# Patient Record
Sex: Female | Born: 1950 | Race: White | Hispanic: No | Marital: Married | Smoking: Never smoker
Health system: Southern US, Community
[De-identification: ages and names within clinical notes are randomized; demographics above are authoritative.]

## PROBLEM LIST (undated history)

## (undated) DIAGNOSIS — F329 Major depressive disorder, single episode, unspecified: Secondary | ICD-10-CM

## (undated) DIAGNOSIS — E785 Hyperlipidemia, unspecified: Secondary | ICD-10-CM

## (undated) DIAGNOSIS — G43909 Migraine, unspecified, not intractable, without status migrainosus: Secondary | ICD-10-CM

## (undated) DIAGNOSIS — K219 Gastro-esophageal reflux disease without esophagitis: Secondary | ICD-10-CM

## (undated) DIAGNOSIS — G4733 Obstructive sleep apnea (adult) (pediatric): Secondary | ICD-10-CM

## (undated) DIAGNOSIS — N200 Calculus of kidney: Secondary | ICD-10-CM

## (undated) DIAGNOSIS — K5730 Diverticulosis of large intestine without perforation or abscess without bleeding: Secondary | ICD-10-CM

## (undated) DIAGNOSIS — F32A Depression, unspecified: Secondary | ICD-10-CM

## (undated) DIAGNOSIS — Z86010 Personal history of colonic polyps: Secondary | ICD-10-CM

## (undated) DIAGNOSIS — I10 Essential (primary) hypertension: Secondary | ICD-10-CM

## (undated) DIAGNOSIS — M1990 Unspecified osteoarthritis, unspecified site: Secondary | ICD-10-CM

## (undated) HISTORY — DX: Depression, unspecified: F32.A

## (undated) HISTORY — DX: Calculus of kidney: N20.0

## (undated) HISTORY — DX: Unspecified osteoarthritis, unspecified site: M19.90

## (undated) HISTORY — DX: Major depressive disorder, single episode, unspecified: F32.9

## (undated) HISTORY — DX: Obstructive sleep apnea (adult) (pediatric): G47.33

## (undated) HISTORY — DX: Migraine, unspecified, not intractable, without status migrainosus: G43.909

## (undated) HISTORY — DX: Personal history of colonic polyps: Z86.010

## (undated) HISTORY — DX: Hyperlipidemia, unspecified: E78.5

## (undated) HISTORY — DX: Diverticulosis of large intestine without perforation or abscess without bleeding: K57.30

## (undated) HISTORY — DX: Gastro-esophageal reflux disease without esophagitis: K21.9

## (undated) HISTORY — PX: TUBAL LIGATION: SHX77

## (undated) HISTORY — DX: Essential (primary) hypertension: I10

---

## 2003-10-31 HISTORY — PX: STONE EXTRACTION WITH BASKET: SHX5318

## 2004-04-21 ENCOUNTER — Other Ambulatory Visit: Admission: RE | Admit: 2004-04-21 | Discharge: 2004-04-21 | Payer: Self-pay | Admitting: Internal Medicine

## 2004-04-21 ENCOUNTER — Ambulatory Visit: Payer: Self-pay | Admitting: Internal Medicine

## 2004-04-22 ENCOUNTER — Ambulatory Visit: Payer: Self-pay | Admitting: Internal Medicine

## 2004-10-20 ENCOUNTER — Ambulatory Visit: Payer: Self-pay | Admitting: Internal Medicine

## 2005-04-21 ENCOUNTER — Ambulatory Visit: Payer: Self-pay | Admitting: Internal Medicine

## 2005-04-21 ENCOUNTER — Other Ambulatory Visit: Admission: RE | Admit: 2005-04-21 | Discharge: 2005-04-21 | Payer: Self-pay | Admitting: Internal Medicine

## 2005-04-21 LAB — CONVERTED CEMR LAB: Pap Smear: NORMAL

## 2005-05-26 ENCOUNTER — Ambulatory Visit: Payer: Self-pay | Admitting: Internal Medicine

## 2005-06-05 ENCOUNTER — Ambulatory Visit: Payer: Self-pay | Admitting: Internal Medicine

## 2005-06-19 ENCOUNTER — Ambulatory Visit: Payer: Self-pay | Admitting: Internal Medicine

## 2005-06-23 ENCOUNTER — Ambulatory Visit: Payer: Self-pay | Admitting: Internal Medicine

## 2005-07-30 HISTORY — PX: ESOPHAGOGASTRODUODENOSCOPY: SHX1529

## 2005-08-03 ENCOUNTER — Encounter (INDEPENDENT_AMBULATORY_CARE_PROVIDER_SITE_OTHER): Payer: Self-pay | Admitting: Specialist

## 2005-08-03 ENCOUNTER — Ambulatory Visit: Payer: Self-pay | Admitting: Internal Medicine

## 2005-09-14 ENCOUNTER — Ambulatory Visit: Payer: Self-pay | Admitting: Internal Medicine

## 2005-09-14 ENCOUNTER — Encounter: Payer: Self-pay | Admitting: Internal Medicine

## 2005-09-17 ENCOUNTER — Ambulatory Visit: Payer: Self-pay | Admitting: Specialist

## 2005-09-17 ENCOUNTER — Encounter: Payer: Self-pay | Admitting: Pulmonary Disease

## 2005-10-19 ENCOUNTER — Ambulatory Visit: Payer: Self-pay | Admitting: Internal Medicine

## 2006-04-26 ENCOUNTER — Ambulatory Visit: Payer: Self-pay | Admitting: Internal Medicine

## 2006-04-28 ENCOUNTER — Ambulatory Visit: Payer: Self-pay | Admitting: Internal Medicine

## 2006-04-28 ENCOUNTER — Encounter: Payer: Self-pay | Admitting: Internal Medicine

## 2006-05-13 ENCOUNTER — Ambulatory Visit: Payer: Self-pay | Admitting: Pulmonary Disease

## 2006-05-27 ENCOUNTER — Ambulatory Visit: Payer: Self-pay | Admitting: Internal Medicine

## 2006-06-08 ENCOUNTER — Ambulatory Visit: Payer: Self-pay | Admitting: Internal Medicine

## 2006-08-30 ENCOUNTER — Ambulatory Visit: Payer: Self-pay | Admitting: Internal Medicine

## 2006-09-21 ENCOUNTER — Ambulatory Visit: Payer: Self-pay | Admitting: Internal Medicine

## 2007-04-21 ENCOUNTER — Encounter: Payer: Self-pay | Admitting: Internal Medicine

## 2007-04-21 DIAGNOSIS — G4733 Obstructive sleep apnea (adult) (pediatric): Secondary | ICD-10-CM | POA: Insufficient documentation

## 2007-04-21 DIAGNOSIS — N200 Calculus of kidney: Secondary | ICD-10-CM | POA: Insufficient documentation

## 2007-04-21 DIAGNOSIS — M810 Age-related osteoporosis without current pathological fracture: Secondary | ICD-10-CM | POA: Insufficient documentation

## 2007-04-21 DIAGNOSIS — F3289 Other specified depressive episodes: Secondary | ICD-10-CM | POA: Insufficient documentation

## 2007-04-21 DIAGNOSIS — F329 Major depressive disorder, single episode, unspecified: Secondary | ICD-10-CM

## 2007-04-21 DIAGNOSIS — K219 Gastro-esophageal reflux disease without esophagitis: Secondary | ICD-10-CM

## 2007-04-21 DIAGNOSIS — I10 Essential (primary) hypertension: Secondary | ICD-10-CM | POA: Insufficient documentation

## 2007-04-23 DIAGNOSIS — M1990 Unspecified osteoarthritis, unspecified site: Secondary | ICD-10-CM | POA: Insufficient documentation

## 2007-05-06 ENCOUNTER — Ambulatory Visit: Payer: Self-pay | Admitting: Internal Medicine

## 2007-05-07 ENCOUNTER — Encounter: Payer: Self-pay | Admitting: Internal Medicine

## 2007-05-31 ENCOUNTER — Encounter: Payer: Self-pay | Admitting: Internal Medicine

## 2007-07-06 ENCOUNTER — Ambulatory Visit: Payer: Self-pay | Admitting: Internal Medicine

## 2007-07-06 ENCOUNTER — Encounter: Payer: Self-pay | Admitting: Internal Medicine

## 2007-07-12 ENCOUNTER — Encounter (INDEPENDENT_AMBULATORY_CARE_PROVIDER_SITE_OTHER): Payer: Self-pay | Admitting: *Deleted

## 2007-08-18 ENCOUNTER — Telehealth (INDEPENDENT_AMBULATORY_CARE_PROVIDER_SITE_OTHER): Payer: Self-pay | Admitting: *Deleted

## 2007-08-23 ENCOUNTER — Ambulatory Visit: Payer: Self-pay | Admitting: Ophthalmology

## 2008-07-05 ENCOUNTER — Telehealth: Payer: Self-pay | Admitting: Internal Medicine

## 2008-07-26 ENCOUNTER — Ambulatory Visit: Payer: Self-pay | Admitting: Family Medicine

## 2008-09-11 ENCOUNTER — Ambulatory Visit: Payer: Self-pay | Admitting: Obstetrics & Gynecology

## 2009-01-28 ENCOUNTER — Ambulatory Visit: Payer: Self-pay | Admitting: Internal Medicine

## 2009-04-05 ENCOUNTER — Ambulatory Visit: Payer: Self-pay | Admitting: Internal Medicine

## 2009-04-16 ENCOUNTER — Encounter: Payer: Self-pay | Admitting: Internal Medicine

## 2009-05-01 HISTORY — PX: TOTAL HIP ARTHROPLASTY: SHX124

## 2009-05-02 ENCOUNTER — Inpatient Hospital Stay (HOSPITAL_COMMUNITY): Admission: RE | Admit: 2009-05-02 | Discharge: 2009-05-05 | Payer: Self-pay | Admitting: Orthopedic Surgery

## 2009-06-25 ENCOUNTER — Telehealth: Payer: Self-pay | Admitting: Internal Medicine

## 2009-06-26 ENCOUNTER — Ambulatory Visit: Payer: Self-pay | Admitting: Pulmonary Disease

## 2009-07-01 ENCOUNTER — Ambulatory Visit: Payer: Self-pay | Admitting: Internal Medicine

## 2009-07-01 DIAGNOSIS — R030 Elevated blood-pressure reading, without diagnosis of hypertension: Secondary | ICD-10-CM | POA: Insufficient documentation

## 2009-07-01 LAB — CONVERTED CEMR LAB: Glucose, Bld: 84 mg/dL

## 2009-08-22 ENCOUNTER — Telehealth: Payer: Self-pay | Admitting: Family Medicine

## 2009-09-02 ENCOUNTER — Encounter: Payer: Self-pay | Admitting: Pulmonary Disease

## 2009-09-03 ENCOUNTER — Encounter: Payer: Self-pay | Admitting: Pulmonary Disease

## 2009-11-12 ENCOUNTER — Ambulatory Visit (HOSPITAL_COMMUNITY): Admission: RE | Admit: 2009-11-12 | Discharge: 2009-11-12 | Payer: Self-pay | Admitting: Podiatry

## 2009-11-14 ENCOUNTER — Encounter: Payer: Self-pay | Admitting: Internal Medicine

## 2009-11-15 ENCOUNTER — Ambulatory Visit: Payer: Self-pay | Admitting: Orthopedic Surgery

## 2009-12-18 ENCOUNTER — Encounter: Payer: Self-pay | Admitting: Internal Medicine

## 2010-01-07 ENCOUNTER — Ambulatory Visit: Payer: Self-pay | Admitting: Internal Medicine

## 2010-01-07 DIAGNOSIS — M109 Gout, unspecified: Secondary | ICD-10-CM

## 2010-03-28 ENCOUNTER — Telehealth: Payer: Self-pay | Admitting: Internal Medicine

## 2010-04-08 ENCOUNTER — Ambulatory Visit: Payer: Self-pay | Admitting: Family Medicine

## 2010-04-23 ENCOUNTER — Ambulatory Visit: Payer: Self-pay

## 2010-04-24 ENCOUNTER — Encounter: Payer: Self-pay | Admitting: Pulmonary Disease

## 2010-04-29 ENCOUNTER — Telehealth (INDEPENDENT_AMBULATORY_CARE_PROVIDER_SITE_OTHER): Payer: Self-pay | Admitting: *Deleted

## 2010-06-09 ENCOUNTER — Ambulatory Visit
Admission: RE | Admit: 2010-06-09 | Discharge: 2010-06-09 | Payer: Self-pay | Source: Home / Self Care | Attending: Internal Medicine | Admitting: Internal Medicine

## 2010-06-09 ENCOUNTER — Encounter: Payer: Self-pay | Admitting: Internal Medicine

## 2010-06-09 DIAGNOSIS — H9190 Unspecified hearing loss, unspecified ear: Secondary | ICD-10-CM | POA: Insufficient documentation

## 2010-06-23 ENCOUNTER — Encounter: Payer: Self-pay | Admitting: Internal Medicine

## 2010-07-01 NOTE — Progress Notes (Signed)
Summary: ? BP elevated  Phone Note Call from Patient Call back at [phone]   Caller: Patient Call For: Cindee Salt MD Summary of Call: Pt was told by pt's physical therapist needed to have BP check to see if needed medication.  Two weeks ago Physical therapist took BP and was 160/95. Presently pt does not take BP med. Pt wanted to see if Dr. Alphonsus Sias felt that was high enough to come in for an appt. Initial call taken by: Lewanda Rife LPN,  June 25, 2009 12:09 PM  Follow-up for Phone Call        If it was consistently elevated at that level, meds would be appropriate. On her last visit here, it was 118/80 If she feels okay, have her set up an appt in the next few weeks to recheck BP (unless therapist rechecks and it is not elevated anymore) Follow-up by: Cindee Salt MD,  June 25, 2009 2:04 PM  Additional Follow-up for Phone Call Additional follow up Details #1::        pt scheduled appt on 07/01/2009 Additional Follow-up by: Mervin Hack CMA Duncan Dull),  June 25, 2009 2:49 PM

## 2010-07-01 NOTE — Progress Notes (Signed)
Summary: needs antibiotic  Phone Note Call from Patient Call back at Home Phone [phone]   Caller: Patient Call For: Cindee Salt MD Summary of Call: Patient has a cracked tooth and is going to the dentist next wed. She had a hip replacement last December.  She is asking for an antibiotic be called in to [facility].  Initial call taken by: Melody Comas,  August 22, 2009 2:11 PM    Prescriptions: AMOXICILLIN 500 MG CAPS (AMOXICILLIN) take 4 tabs about 1 hour before dentist appt  #4 x 1   Entered and Authorized by:   Ruthe Mannan MD   Signed by:   Ruthe Mannan MD on 08/22/2009   Method used:   Electronically to        [facility]. [address] [phone]* (retail)       [address] [place]  [address]       Ph: [phone]       Fax: [phone]   RxID:   [id number]   Appended Document: needs antibiotic Spoke with patient and advised results.

## 2010-07-01 NOTE — Assessment & Plan Note (Signed)
Summary: BLOOD PRESSURE/DS   Vital Signs:  Patient profile:   60 year old female Weight:      188 pounds Temp:     98.5 degrees F oral Pulse rate:   68 / minute Pulse rhythm:   regular BP sitting:   128 / 80  (left arm) Cuff size:   large  Vitals Entered By: Mervin Hack CMA Duncan Dull) (July 01, 2009 12:35 PM)  Serial Vital Signs/Assessments:  Time      Position  BP       Pulse  Resp  Temp     By           R Arm     156/102                        Cindee Salt MD  CC: discuss blood pressure   History of Present Illness: Had right THR in December still using cane but not using it at home progressing satisfactorily  PT at home noted increasing BP As high as 160/95 Was taking before starting therapy  Has been eating out more ??more salt  No headaches Had slight chest pain once---went away on its own. Not exertional No SOB  Sugar went up to 203 while in the hospital she is concerned about this  Allergies: 1)  ! Percocet 2)  Wellbutrin (Bupropion Hcl) 3)  Paxil (Paroxetine Hcl)  Past History:  Past medical, surgical, family and social histories (including risk factors) reviewed for relevance to current acute and chronic problems.  Past Medical History: Reviewed history from 04/21/2007 and no changes required. Depression Diverticulosis, colon GERD Hypertension Osteoporosis Kidney stones Osteoarthritis Obstructive sleep apnea Migraines  Past Surgical History: Kidney stone ([facility]off) basket retrieval 06/05 Tubal ligation 1990's Cesarean x 1 Vaginal x 1 EGD - stricture dilated 03/07 Right THR-- 12/10    Dr Despina Hick  Family History: Reviewed history from 04/21/2007 and no changes required. Father: Alive, Hep C, HTN Mother died @ age 57, MI, DM Siblings: 2 Brothers alive CAD in [place] GF (CHF) & mom HTN in  Dad, ? Mom DM  in  Mom, Mat GM Breast cancer in  Pat GM No colon cancer   Social History: Reviewed history from 04/05/2009 and no changes  required. Marital Status: Married Children: 2 Occupation: Embroidering work--sports uniforms Never Smoked Alcohol use-occ  Review of Systems       weight is up 6# Having trouble sleeping---did see Dr Shelle Iron recently about adjusting aspects of her CPAP  Ongoing left foot pain  Physical Exam  General:  alert and normal appearance.   Neck:  supple, no masses, and no thyromegaly.   Lungs:  normal respiratory effort and normal breath sounds.   Heart:  normal rate, regular rhythm, no murmur, and no gallop.   Msk:  left foot with small arch no tenderness Pulses:  1+ pulse in left foot Extremities:  no edema Psych:  normally interactive, good eye contact, not anxious appearing, and not depressed appearing.     Impression & Recommendations:  Problem # 1:  ELEVATED BLOOD PRESSURE WITHOUT DIAGNOSIS OF HYPERTENSION (ICD-796.2) Assessment New no indication of need for BP meds reassured  BP today: 128/80 Prior BP: 126/82 (06/26/2009)  Instructed in low sodium diet (DASH Handout) and behavior modification.    Problem # 2:  HYPERGLYCEMIA (ICD-790.29) Assessment: New  post op random is normal now reassured  Orders: Glucose, (CBG) (78469)  Complete Medication List: 1)  One-a-day Extras  Antioxidant Caps (Multiple vitamins-minerals) .... Take 1 by mouth once daily 2)  Vitamin D 1000 Unit Tabs (Cholecalciferol) .... Take 1 gel tab by mouth once daily 3)  Amoxicillin 500 Mg Caps (Amoxicillin) .... Take 4 tabs about 1 hour before dentist appt 4)  Aspirin Low Dose 81 Mg Tabs (Aspirin) .... Take 2 tabs by mouth at bedtime 5)  Calcium 500 Mg Tabs (Calcium) .... Take 1 by mouth once daily 6)  Magnesium 500 Mg Tabs (Magnesium) .... Take 1 by mouth once daily  Patient Instructions: 1)  Please get stability shoes for foot pain 2)  Please schedule a follow-up appointment in 6 months .   Current Allergies (reviewed today): ! PERCOCET WELLBUTRIN (BUPROPION HCL) PAXIL (PAROXETINE  HCL)  Laboratory Results   Blood Tests     Glucose (random): 84 mg/dL   (Normal Range: 16-109)

## 2010-07-01 NOTE — Letter (Signed)
Summary: [facility]  [facility]   Imported By: Lanelle Bal 12/03/2009 11:53:48  _____________________________________________________________________  External Attachment:    Type:   Image     Comment:   External Document  Appended Document: [facility] seen  for chronic left foot and ankle pain checking MRI to see if posterior tibialis tear voltaren gel orthotics

## 2010-07-01 NOTE — Assessment & Plan Note (Signed)
Summary: 6/mo follow up/ alc   Vital Signs:  Patient profile:   60 year old female Weight:      187.25 pounds Temp:     98.7 degrees F oral Pulse rate:   88 / minute Pulse rhythm:   regular BP sitting:   120 / 84  (left arm) Cuff size:   large  Vitals Entered By: Sydell Axon LPN (January 07, 2010 9:05 AM) CC: 6 Month follow-up   History of Present Illness: Doing okay  Had pinched nerve in neck about 1 month ago affecting her left arm and leg Used soft cervical collar didn't have to miss work No imaging needed other than plain films (arthritis noted)---- seen in urgent care then seen by ortho Brief course of prednisone still with slight numbness in left 1st and 2nd fingers  Hasn't checked BP No headaches No sig chest pain SOme SOB with the left side symptoms---mostly it felt odd on her left side  Uloric has kept gout quiet Not needing colcrys  hasn't been consistent with vitamin D  Allergies: 1)  ! Percocet 2)  Wellbutrin (Bupropion Hcl) 3)  Paxil (Paroxetine Hcl)  Past History:  Past medical, surgical, family and social histories (including risk factors) reviewed for relevance to current acute and chronic problems.  Past Medical History: Depression Diverticulosis, colon GERD Hypertension Osteoporosis Kidney stones Osteoarthritis Obstructive sleep apnea Migraines Gout  Past Surgical History: Reviewed history from 07/01/2009 and no changes required. Kidney stone [facility]) basket retrieval 06/05 Tubal ligation 1990's Cesarean x 1 Vaginal x 1 EGD - stricture dilated 03/07 Right THR-- 12/10    Dr Despina Hick  Family History: Reviewed history from 04/21/2007 and no changes required. Father: Alive, Hep C, HTN Mother died @ age 18, MI, DM Siblings: 2 Brothers alive CAD in [place] GF (CHF) & mom HTN in  Dad, ? Mom DM  in  Mom, Mat GM Breast cancer in  Pat GM No colon cancer   Social History: Reviewed history from 04/05/2009 and no changes  required. Marital Status: Married Children: 2 Occupation: Embroidering work--sports uniforms Never Smoked Alcohol use-occ  Review of Systems       trying to be careful with diet weight fairly stable Trouble exercising with her foot---no room in house for treadmill. Tries to walk sleeps variably---occ awakens despite sleep apnea  Physical Exam  General:  alert and normal appearance.   Neck:  supple, no masses, no thyromegaly, no carotid bruits, and no cervical lymphadenopathy.   Lungs:  normal respiratory effort, no intercostal retractions, no accessory muscle use, and normal breath sounds.   Heart:  normal rate, regular rhythm, no murmur, and no gallop.   Msk:  no joint tenderness and no joint swelling.   Extremities:  no edema Neurologic:  strength normal in all extremities.   Favors left foot walking and getting up on table Psych:  normally interactive, good eye contact, not anxious appearing, and not depressed appearing.     Impression & Recommendations:  Problem # 1:  HYPERTENSION (ICD-401.9) Assessment Unchanged BP fine without meds discussed fitness  BP today: 120/84 Prior BP: 128/80 (07/01/2009)  Problem # 2:  GOUT (ICD-274.9) Assessment: Improved  quiet on the uloric will check labs Rx done  Her updated medication list for this problem includes:    Uloric 40 Mg Tabs (Febuxostat) .Marland Kitchen... Take one by mouth daily    Colcrys 0.6 Mg Tabs (Colchicine) .Marland Kitchen... Take one by mouth daily as needed  during gout attack  Orders:  TLB-Renal Function Panel (80069-RENAL) TLB-Hepatic/Liver Function Pnl (80076-HEPATIC) Venipuncture (40981) TLB-Uric Acid, Blood (84550-URIC)  Problem # 3:  OSTEOPOROSIS (ICD-733.00) Assessment: Unchanged asked her to take the vitamin D regularly needs weight bearing exercise  Her updated medication list for this problem includes:    Vitamin D 1000 Unit Tabs (Cholecalciferol) .Marland Kitchen... Take 1 gel tab by mouth once daily    Calcium 500 Mg Tabs  (Calcium) .Marland Kitchen... Take 1 by mouth monthly  Problem # 4:  OSTEOARTHRITIS (ICD-715.90) Assessment: Unchanged limited from this discussed starting a program  Complete Medication List: 1)  One-a-day Extras Antioxidant Caps (Multiple vitamins-minerals) .... Take 1 by mouth occassionally 2)  Vitamin D 1000 Unit Tabs (Cholecalciferol) .... Take 1 gel tab by mouth once daily 3)  Calcium 500 Mg Tabs (Calcium) .... Take 1 by mouth monthly 4)  Uloric 40 Mg Tabs (Febuxostat) .... Take one by mouth daily 5)  Colcrys 0.6 Mg Tabs (Colchicine) .... Take one by mouth daily as needed  during gout attack  Patient Instructions: 1)  Please schedule a follow-up appointment in 6-9  months for physical Prescriptions: ULORIC 40 MG TABS (FEBUXOSTAT) Take one by mouth daily  #30 x 11   Entered and Authorized by:   Cindee Salt MD   Signed by:   Cindee Salt MD on 01/07/2010   Method used:   Electronically to        [facility] [address] [phone]* (retail)       [address] [place]  [address]       Ph: [phone]       Fax: [phone]   RxID:   [id number]   Current Allergies (reviewed today): ! PERCOCET WELLBUTRIN (BUPROPION HCL) PAXIL (PAROXETINE HCL)  Appended Document: 6/mo follow up/ alc

## 2010-07-01 NOTE — Letter (Signed)
Summary: [facility]   Imported By: Lester Parowan 05/05/2010 08:16:47  _____________________________________________________________________  External Attachment:    Type:   Image     Comment:   External Document

## 2010-07-01 NOTE — Progress Notes (Signed)
Summary: Need antibiotic for dental appt  Phone Note Call from Patient Call back at [phone]   Caller: Patient Call For: Cindee Salt MD Summary of Call: Pt said she needs antibiotic for dental appt next week. Pt had total hip replacement last month. Pt has appt for dental cleaning on 07/04/09.  Pt would like med called in to [facility]. [phone]. Please advise.  Initial call taken by: Lewanda Rife LPN,  June 25, 2009 11:33 AM  Follow-up for Phone Call        amoxicillin 500mg  4 tabs about 1 hour before dentist appt send #4 with 1 refill Follow-up by: Cindee Salt MD,  June 25, 2009 11:43 AM  Additional Follow-up for Phone Call Additional follow up Details #1::        Rx Called In, Spoke with patient and advised results.  Additional Follow-up by: Mervin Hack CMA Duncan Dull),  June 25, 2009 12:13 PM    New/Updated Medications: AMOXICILLIN 500 MG CAPS (AMOXICILLIN) take 4 tabs about 1 hour before dentist appt Prescriptions: AMOXICILLIN 500 MG CAPS (AMOXICILLIN) take 4 tabs about 1 hour before dentist appt  #4 x 1   Entered by:   Mervin Hack CMA (AAMA)   Authorized by:   Cindee Salt MD   Signed by:   Mervin Hack CMA (AAMA) on 06/25/2009   Method used:   Electronically to        [facility] [address] [phone]* (retail)       [address] [place]  [address]       Ph: [phone]       Fax: [phone]   RxID:   [id number]

## 2010-07-01 NOTE — Progress Notes (Signed)
Summary: wants to change to allopurinol  Phone Note Call from Patient Call back at Home Phone [phone]   Caller: Patient Call For: Martha Salt MD Summary of Call: Pt wants to change from uloric to allopurinol, she says this will be less costly.  She wants this sent to [facility] [address] Initial call taken by: Lowella Petties CMA, AAMA,  March 28, 2010 8:33 AM  Follow-up for Phone Call        Okay to change to allopurinol 300mg  daily 1 yr Rx Follow-up by: Martha Salt MD,  March 28, 2010 8:53 AM  Additional Follow-up for Phone Call Additional follow up Details #1::        Rx Called In, Spoke with patient and advised results.  Additional Follow-up by: Mervin Hack CMA Duncan Dull),  March 28, 2010 12:21 PM    New/Updated Medications: ALLOPURINOL 300 MG TABS (ALLOPURINOL) TAKE 1 by mouth once daily Prescriptions: ALLOPURINOL 300 MG TABS (ALLOPURINOL) TAKE 1 by mouth once daily  #90 x 3   Entered by:   Mervin Hack CMA (AAMA)   Authorized by:   Martha Salt MD   Signed by:   Mervin Hack CMA (AAMA) on 03/28/2010   Method used:   Faxed to ...       [facility] (retail)       [address]       [place], [place]    [place]       Ph: [phone]       Fax: [phone]   RxID:   [id number]

## 2010-07-01 NOTE — Letter (Signed)
Summary: [facility]  [facility]   Imported By: Lanelle Bal 01/03/2010 13:52:08  _____________________________________________________________________  External Attachment:    Type:   Image     Comment:   External Document  Appended Document: [facility] Ankle MRI reviewed New shoes and orthotics recommended for now

## 2010-07-01 NOTE — Progress Notes (Signed)
Summary: office visit due in January  Phone Note Outgoing Call   Call placed by: Kandice Hams CMA,  April 29, 2010 12:03 PM Call placed to: Patient Summary of Call: Patient due for office visit in Jan per Dr clance, left msg for pt to call to schedule .Kandice Hams [facility]  April 29, 2010 12:03 PM

## 2010-07-01 NOTE — Miscellaneous (Signed)
Summary: Orders Update  Clinical Lists Changes  Orders: Added new Referral order of DME Referral (DME) - Signed 

## 2010-07-01 NOTE — Assessment & Plan Note (Signed)
Summary: self referral for osa   Copy to:  Self Referral Primary Provider/Referring Provider:  Cindee Salt MD  CC:  Sleep Consult.  History of Present Illness: The pt is a 60y/o female who comes in today for management of osa.  She has been seen in the past for mild osa, and has been on cpap since that time.  She was last seen 2007, and never followed up with me.  She comes in today where she is having issues with cpap tolerance.  She is wearing a full face mask, but it is 80-25 years old.  She is having difficulties with air gulping, leading to bloating and discomfort, as well as intermittant nausea.  She is not able to wear nasal mask or nasal pillows due to mouth opening.  She typically goes to bed at 11pm, and arises at 8:30am to start her day.  She does have a lot of awakenings during the night, and is not completely rested in the am's upon arising.  She has some sleep pressure with periods of inactivity during the day, but this has become more of an issue since she had hip surgery.  She thinks that MSK discomfort at night may be contributing to this.  Her epworth score today is elevated at 15.  She denies having kicking at night, but does have a difficult time since the surgery finding a comfortable sleeping position.  Of note, her weight is up only 6 pounds since the last visit.  Current Medications (verified): 1)  One-A-Day Extras Antioxidant  Caps (Multiple Vitamins-Minerals) .... Take 1 By Mouth Once Daily 2)  Vitamin D 1000 Unit Tabs (Cholecalciferol) .... Take 1 Gel Tab By Mouth Once Daily 3)  Naftin 1 % Crea (Naftifine Hcl) .... Apply Two Times A Day To Groin Rash Till Clear 4)  Amoxicillin 500 Mg Caps (Amoxicillin) .... Take 4 Tabs About 1 Hour Before Dentist Appt 5)  Aspirin Low Dose 81 Mg Tabs (Aspirin) .... Take 2 Tabs By Mouth At Bedtime 6)  Calcium .... Take 1 Tab By Mouth At Bedtime 7)  Magnesium .... Take 1 Tab By Mouth At Bedtime  Allergies: 1)  ! Percocet 2)   Wellbutrin (Bupropion Hcl) 3)  Paxil (Paroxetine Hcl)  Past History:  Past Medical History: Reviewed history from 04/21/2007 and no changes required. Depression Diverticulosis, colon GERD Hypertension Osteoporosis Kidney stones Osteoarthritis Obstructive sleep apnea Migraines  Past Surgical History: Reviewed history from 04/21/2007 and no changes required. Kidney stone (Storoff) basket retrieval 06/05 Tubal ligation 1990's Cesarean x 1 Vaginal x 1 EGD - stricture dilated 03/07  Family History: Reviewed history from 04/21/2007 and no changes required. Father: Alive, Hep C, HTN Mother died @ age 101, MI, DM Siblings: 2 Brothers alive CAD in [place] GF (CHF) & mom HTN in  Dad, ? Mom DM  in  Mom, Mat GM Breast cancer in  Pat GM No colon cancer   Social History: Reviewed history from 04/05/2009 and no changes required. Marital Status: Married Children: 2 Occupation: Embroidering work--sports uniforms Never Smoked Alcohol use-occ  Review of Systems       The patient complains of abdominal pain, nasal congestion/difficulty breathing through nose, sneezing, hand/feet swelling, and joint stiffness or pain.  The patient denies shortness of breath with activity, shortness of breath at rest, productive cough, non-productive cough, coughing up blood, chest pain, irregular heartbeats, acid heartburn, indigestion, loss of appetite, weight change, difficulty swallowing, sore throat, tooth/dental problems, headaches, itching, ear ache, anxiety, depression,  rash, change in color of mucus, and fever.    Vital Signs:  Patient profile:   60 year old female Height:      62 inches Weight:      190 pounds BMI:     34.88 O2 Sat:      93 % on Room air Temp:     97.8 degrees F oral Pulse rate:   95 / minute BP sitting:   126 / 82  (left arm) Cuff size:   regular  Vitals Entered By: Arman Filter LPN (June 26, 2009 9:07 AM)  O2 Flow:  Room air CC: Sleep Consult Comments Medications  reviewed with patient Arman Filter LPN  June 26, 2009 9:14 AM    Physical Exam  General:  ow female in nad Eyes:  PERRLA and EOMI.   Nose:  narrowed bilat but patent, no discharge. Mouth:  mild elongation of soft palate and uvula Neck:  no jvd, tmg, LN Lungs:  clear to auscultation Heart:  rrr, no mrg Abdomen:  soft and nontender, bs+ Extremities:  no edema noted, pulses intact distally no cyanosis Neurologic:  alert and oriented, moves all 4.   Impression & Recommendations:  Problem # 1:  SLEEP APNEA, OBSTRUCTIVE (ICD-327.23) the pt has known mild osa, with positive clinical response to cpap.  She is now having issues with air gulping, and also an increase in symptoms of daytime sleepiness.  Although, I suspect some of the sleepiness is due to her MSK issues disrupting sleep.  Air gulping can be a difficult issue to troubleshoot, and we typically make changes to mask, pressure delivery modes, and re-optimize pressure.  Her mask is over a year old, and therefore would like to get her a new one.  Will also get her an autotitrating device for 2 weeks to re-optimize her pressure, and also to see if her air gulping is better on varying pressure.  I have also encouraged her to work on weight loss.  Medications Added to Medication List This Visit: 1)  Aspirin Low Dose 81 Mg Tabs (Aspirin) .... Take 2 tabs by mouth at bedtime 2)  Calcium  .... Take 1 tab by mouth at bedtime 3)  Magnesium  .... Take 1 tab by mouth at bedtime  Other Orders: New Patient Level IV (16109) DME Referral (DME)  Patient Instructions: 1)  will get you a new mask.  Look at some of the newer options 2)  will get you an auto titrating machine for 2 weeks to re-optimize your pressure, and also to see if you have less air gulping on this type of pressure delivery. 3)  work on weight loss 4)  will arrange followup with me once we get the above information.

## 2010-07-03 NOTE — Assessment & Plan Note (Signed)
Summary: CPX/JRR   Vital Signs:  Patient profile:   60 year old female Weight:      191 pounds BMI:     35.06 Temp:     98.9 degrees F oral Pulse rate:   90 / minute Pulse rhythm:   regular BP sitting:   142 / 98  (left arm) Cuff size:   regular  Vitals Entered By: Mervin Hack CMA Duncan Dull) (June 09, 2010 11:54 AM) CC: adult physical, Hypertension Management   History of Present Illness: Doing okay Just had gyn exam-- hasn't heard about pap yet Did have mammogram in past year  Having some trouble with gums Does keep up with the dentist  has done okay with the allopurinol Does get some flares with her foot hasn't taken other meds lately Has used colchicine in past    Hypertension History:      Positive major cardiovascular risk factors include female age 60 years old or older and hypertension.  Negative major cardiovascular risk factors include non-tobacco-user status.     Allergies: 1)  ! Percocet 2)  Wellbutrin (Bupropion Hcl) 3)  Paxil (Paroxetine Hcl)  Past History:  Past medical, surgical, family and social histories (including risk factors) reviewed for relevance to current acute and chronic problems.  Past Medical History: Reviewed history from 01/07/2010 and no changes required. Depression Diverticulosis, colon GERD Hypertension Osteoporosis Kidney stones Osteoarthritis Obstructive sleep apnea Migraines Gout  Past Surgical History: Reviewed history from 07/01/2009 and no changes required. Kidney stone [facility]) basket retrieval 06/05 Tubal ligation 1990's Cesarean x 1 Vaginal x 1 EGD - stricture dilated 03/07 Right THR-- 12/10    Dr Despina Hick  Family History: Reviewed history from 04/21/2007 and no changes required. Father: Alive, Hep C, HTN Mother died @ age 40, MI, DM Siblings: 2 Brothers alive CAD in [place] GF (CHF) & mom HTN in  Dad, ? Mom DM  in  Mom, Mat GM Breast cancer in  Pat GM No colon cancer   Social History: Reviewed  history from 04/05/2009 and no changes required. Marital Status: Married Children: 2 Occupation: Embroidering work--sports uniforms Never Smoked Alcohol use-occ  Review of Systems General:  weight is up a few pounds generally sleeps okay--uses CPAP but unsure of how well it works SOme AM fatigue but not bad wears seat belt. Eyes:  Complains of double vision; denies vision loss-1 eye; notes some mild diplopia when tired in PM--more like blurry due for eye exam. ENT:  Complains of decreased hearing and ringing in ears; ongoing hearing problems wants eval due for dentist--some gum problems. CV:  Complains of palpitations and shortness of breath with exertion; denies chest pain or discomfort, difficulty breathing at night, difficulty breathing while lying down, fainting, and lightheadness; Occ skip beats--rarely Fairly sedentary---some notice about DOE. Resp:  Denies cough and shortness of breath. GI:  Complains of indigestion; denies abdominal pain, bloody stools, change in bowel habits, dark tarry stools, nausea, and vomiting; occ hearburn--uses OTC ranitidine. GU:  Complains of urinary frequency; denies dysuria and incontinence; no sexual problems. MS:  Complains of joint pain; denies joint swelling; some left hip pain---Dr Waylan Rocher did check x-rays and just has early problems. Derm:  Complains of itching and lesion(s); denies rash; some brown spots under right breast Occ itching on thigh--relates to dragging a deer . Neuro:  Complains of headaches; denies numbness, tingling, and weakness; occ sensory changes on face No recent headaches. Psych:  Denies anxiety and depression. Heme:  Denies abnormal bruising and  enlarge lymph nodes. Allergy:  Denies seasonal allergies and sneezing.  Physical Exam  General:  alert and normal appearance.   Eyes:  pupils equal, pupils round, pupils reactive to light, and no optic disk abnormalities.   Ears:  R ear normal and L ear normal.   Mouth:  no  erythema, no exudates, and no lesions.   Neck:  supple, no masses, no thyromegaly, no carotid bruits, and no cervical lymphadenopathy.   Lungs:  normal respiratory effort, no intercostal retractions, no accessory muscle use, and normal breath sounds.   Heart:  normal rate, regular rhythm, no murmur, and no gallop.   Abdomen:  soft and non-tender.   Msk:  no joint tenderness and no joint swelling.   Pulses:  2+ in feet Extremities:  no edema Neurologic:  alert & oriented X3, strength normal in all extremities, and gait normal.   Skin:  no rashes and no suspicious lesions.   2 early seb keratoses under right breast Axillary Nodes:  No palpable lymphadenopathy Psych:  normally interactive, good eye contact, not anxious appearing, and not depressed appearing.     Impression & Recommendations:  Problem # 1:  PREVENTIVE HEALTH CARE (ICD-V70.0) Assessment Comment Only not yet due for colon recent pap up to date wtih mammo discussed fitness  Problem # 2:  HEARING LOSS (ICD-389.9) Assessment: New  more noticeable ready for eval  Orders: ENT Referral (ENT)  Problem # 3:  HYPERTENSION (ICD-401.9) Assessment: Deteriorated  will hold off on meds  discussed fitness and her efforts to control BP may need to try meds if not going down  BP today: 142/98 Prior BP: 120/84 (01/07/2010)  10 Yr Risk Heart Disease: Not enough information  Orders: Venipuncture (04540) TLB-Renal Function Panel (80069-RENAL) TLB-CBC Platelet - w/Differential (85025-CBCD) TLB-Hepatic/Liver Function Pnl (80076-HEPATIC) TLB-TSH (Thyroid Stimulating Hormone) (84443-TSH)  Problem # 4:  GOUT (ICD-274.9) Assessment: Comment Only  on allopurinol will check uric acid level  The following medications were removed from the medication list:    Uloric 40 Mg Tabs (Febuxostat) .Marland Kitchen... Take one by mouth daily Her updated medication list for this problem includes:    Allopurinol 300 Mg Tabs (Allopurinol) .Marland Kitchen... Take 1  by mouth once daily    Ibuprofen 200 Mg Tabs (Ibuprofen) .Marland Kitchen... 2-3 tabs by mouth three times a day with food as needed for gout pain  Orders: TLB-Uric Acid, Blood (84550-URIC)  Complete Medication List: 1)  One-a-day Extras Antioxidant Caps (Multiple vitamins-minerals) .... Take 1 by mouth occassionally 2)  Vitamin D 1000 Unit Tabs (Cholecalciferol) .... Take 1 gel tab by mouth once daily 3)  Calcium 500 Mg Tabs (Calcium) .... Take 1 by mouth monthly 4)  Allopurinol 300 Mg Tabs (Allopurinol) .... Take 1 by mouth once daily 5)  Ibuprofen 200 Mg Tabs (Ibuprofen) .... 2-3 tabs by mouth three times a day with food as needed for gout pain  Hypertension Assessment/Plan:      The patient's hypertensive risk group is category B: At least one risk factor (excluding diabetes) with no target organ damage.  Today's blood pressure is 142/98.    Patient Instructions: 1)  Please try ibuprofen as needed for the gout pain 2)  Please schedule a follow-up appointment in 6 months .  3)  Referral Appointment Information 4)  Day/Date: 5)  Time: 6)  Place/MD: 7)  Address: 8)  Phone/Fax: 9)  Patient given appointment information. Information/Orders faxed/mailed.   Orders Added: 1)  ENT Referral [ENT] 2)  Est. Patient 40-64  years [99396] 3)  TLB-Uric Acid, Blood [84550-URIC] 4)  Venipuncture [36415] 5)  TLB-Renal Function Panel [80069-RENAL] 6)  TLB-CBC Platelet - w/Differential [85025-CBCD] 7)  TLB-Hepatic/Liver Function Pnl [80076-HEPATIC] 8)  TLB-TSH (Thyroid Stimulating Hormone) [62952-WUX]    Current Allergies (reviewed today): ! PERCOCET WELLBUTRIN (BUPROPION HCL) PAXIL (PAROXETINE HCL)  Appended Document: CPX/JRR

## 2010-07-17 NOTE — Letter (Signed)
Summary: [facility]   [facility]   Imported By: Kassie Mends 07/07/2010 11:10:20  _____________________________________________________________________  External Attachment:    Type:   Image     Comment:   External Document  Appended Document: [facility]  bilateral sensorineural hearing loss recommending hearing aides

## 2010-09-02 LAB — BASIC METABOLIC PANEL
BUN: 15 mg/dL (ref 6–23)
CO2: 25 mEq/L (ref 19–32)
Calcium: 8.3 mg/dL — ABNORMAL LOW (ref 8.4–10.5)
Chloride: 103 mEq/L (ref 96–112)
Creatinine, Ser: 0.9 mg/dL (ref 0.4–1.2)
GFR calc Af Amer: >60 mL/min (ref >60)
GFR calc non Af Amer: >60 mL/min (ref >60)
GFR calc non Af Amer: >60 mL/min (ref >60)
Glucose, Bld: 127 mg/dL — ABNORMAL HIGH (ref 70–99)
Potassium: 4.4 mEq/L (ref 3.5–5.1)
Sodium: 137 mEq/L (ref 135–145)
Sodium: 137 mEq/L (ref 135–145)

## 2010-09-02 LAB — CBC
HCT: 33.3 % — ABNORMAL LOW (ref 36.0–46.0)
HCT: 35.2 % — ABNORMAL LOW (ref 36.0–46.0)
Hemoglobin: 11 g/dL — ABNORMAL LOW (ref 12.0–15.0)
MCHC: 33.1 g/dL (ref 30.0–36.0)
MCHC: 33.2 g/dL (ref 30.0–36.0)
MCV: 93.7 fL (ref 78.0–100.0)
MCV: 93.8 fL (ref 78.0–100.0)
Platelets: 264 10*3/uL (ref 150–400)
RBC: 4.28 MIL/uL (ref 3.87–5.11)
RDW: 12.6 % (ref 11.5–15.5)
RDW: 13.3 % (ref 11.5–15.5)
RDW: 13.3 % (ref 11.5–15.5)
WBC: 12.3 10*3/uL — ABNORMAL HIGH (ref 4.0–10.5)
WBC: 9.3 10*3/uL (ref 4.0–10.5)

## 2010-09-02 LAB — PROTIME-INR
INR: 1.73 — ABNORMAL HIGH (ref 0.00–1.49)
Prothrombin Time: 20.1 seconds — ABNORMAL HIGH (ref 11.6–15.2)

## 2010-09-02 LAB — TYPE AND SCREEN

## 2010-09-02 LAB — ABO/RH: ABO/RH(D): A POS

## 2010-09-03 LAB — URINE MICROSCOPIC-ADD ON

## 2010-09-03 LAB — PROTIME-INR: Prothrombin Time: 12.2 seconds (ref 11.6–15.2)

## 2010-09-03 LAB — COMPREHENSIVE METABOLIC PANEL
AST: 20 U/L (ref 0–37)
Alkaline Phosphatase: 68 U/L (ref 39–117)
BUN: 10 mg/dL (ref 6–23)
Calcium: 9.2 mg/dL (ref 8.4–10.5)
Chloride: 103 mEq/L (ref 96–112)
GFR calc non Af Amer: >60 mL/min (ref >60)
Total Bilirubin: 1 mg/dL (ref 0.3–1.2)
Total Protein: 7.2 g/dL (ref 6.0–8.3)

## 2010-09-03 LAB — URINALYSIS, ROUTINE W REFLEX MICROSCOPIC
Glucose, UA: NEGATIVE mg/dL
Ketones, ur: NEGATIVE mg/dL
Nitrite: NEGATIVE
Urobilinogen, UA: 0.2 mg/dL (ref 0.0–1.0)

## 2010-09-03 LAB — CBC
Hemoglobin: 15.8 g/dL — ABNORMAL HIGH (ref 12.0–15.0)
Platelets: 191 10*3/uL (ref 150–400)

## 2010-10-14 NOTE — Assessment & Plan Note (Signed)
NAME:  Martha Meyer, Martha Meyer                ACCOUNT NO.:  [id number]   MEDICAL RECORD NO.:  [id number]          PATIENT TYPE:  POB   LOCATION:  [facility] at [facility]         FACILITY:  [facility]   PHYSICIAN:  Tinnie Gens, MD        DATE OF BIRTH:  11-17-1950   DATE OF SERVICE:  04/08/2010                                  CLINIC NOTE   CHIEF COMPLAINT:  Physical exam.   HISTORY OF PRESENT ILLNESS:  The patient is a 60 year old gravida 2,  para 2 who has been menopausal since 1990s, who comes in today for  yearly exam.  She has no GYN complaints.  Except for urinary frequency,  she denies specifically stress urinary incontinence nor does she have  urge urinary incontinence.  She does have nocturia and urinary frequency  during the day.  She is concerned that she might have diabetes.  She  sees Dr. Pasty Arch and reports that he has not assured her he has checked  her for diabetes  although she is unsure of that.   PAST MEDICAL HISTORY:  Significant for osteoporosis, menopause, kidney  stones, sleep apnea, degenerative arthritis, hypertension, GERD, and  gout.   PAST SURGICAL HISTORY:  She had C-section and BTL and a cystoscopy for  kidney stones.   ALLERGIES:  None known.  No latex allergies.   MEDICATIONS:  1. Calcium 1 p.o. daily.  2. Vitamin D 1 p.o. daily.  3. Uloric 1 p.o. daily.  4. Allopurinol 1 p.o. daily.   FAMILY HISTORY:  Significant for diabetes, heart disease, liver cancer  in her father secondary to hepatitis C.   SOCIAL HISTORY:  The patient works in Film/video editor.  She lives with her  husband.  She has 1 grandchild.  She is nonsmoker.  She drinks alcohol  once or twice yearly.   OBSTETRICAL HISTORY:  She is G2, P2.  She had 1 C-section followed by  VBAC.   GYN HISTORY:  Last Pap was normal in February 2010.  She does have a  history of abnormal Pap smear.  She had a colonoscopy in 2003.  Her last  mammogram was about 2 years ago.   A 14-point review of systems reviewed and  diffusely positive.  The  patient suffered with gout this year and degenerative arthritis on her  hip and maybe on her low back as well.  She complains of urinary  frequency.  She complains of pain in her left side that she is unclear  about the etiology and wonders if she has a swollen spleen.  She denies  specifically chest pain, shortness of breath, breast lumps or masses,  constipation, diarrhea, blood in her stool or urine.   PHYSICAL EXAMINATION:  VITAL SIGNS:  Today, her blood pressure 138/97,  weight 189.5, she is up from 186 last year, pulse is 70.  GENERAL:  She is obese female, in no acute distress.  HEENT:  Normocephalic, atraumatic.  Sclerae anicteric.  NECK:  Supple.  Normal thyroid.  LUNGS:  Clear bilaterally.  CV:  Regular rate and rhythm without rubs, gallops, or murmurs.  ABDOMEN:  Soft, nontender, nondistended.  EXTREMITIES:  No cyanosis, clubbing, or  edema.  2+ distal pulses.  BREASTS:  Symmetric with everted nipples.  No masses.  No  supraclavicular or axillary adenopathy.  GU:  Normal external female genitalia.  BUS is normal.  Vagina is pink  and rugated.  Cervix is parous without lesion.  No adnexal mass or  tenderness although exam is somewhat limited by body habitus.   IMPRESSION:  1. GYN exam with Pap.  2. Urinary frequency, unclear etiology.   PLAN:  Pap smear today.  Mammogram scheduled for April 23, 2010.  Follow up with Dr. Pasty Arch for other problems and may need to see  urologist for her urinary frequency, unclear as to exact etiology of  this.  I did discuss possible alternatives or possible explanations for  this as incontinence does not seem to be her issue.  I will think to  have her easy plan for how to fix this problem.  She will follow up with  her primary care physician as needed for this problem.           ______________________________  Tinnie Gens, MD     TP/MEDQ  D:  04/08/2010  T:  04/09/2010  Job:  [id number]

## 2010-10-14 NOTE — Assessment & Plan Note (Signed)
NAME:  Martha Meyer, Martha Meyer                ACCOUNT NO.:  [id number]   MEDICAL RECORD NO.:  [id number]          PATIENT TYPE:  POB   LOCATION:  [facility]         FACILITY:  [facility]   PHYSICIAN:  Tinnie Gens, MD        DATE OF BIRTH:  [birth year]   DATE OF SERVICE:                                  CLINIC NOTE   HISTORY OF PRESENT ILLNESS:  The patient is a 60 year old gravida 2,  para 2, who has been menopausal since 1990s who comes in today as a new  patient for yearly exam.  She has lot of significant GYN complaints.  She has not had a Pap or mammogram since January 2008.  She has a  primary care doctor, Dr. Alphonsus Sias who treats her for multiple other  problems.   PAST MEDICAL HISTORY:  Osteoporosis, menopause, kidney stones, sleep  apnea, arthritis, hypertension, GERD.   PAST SURGICAL HISTORY:  C-section, BTL, cystitis, kidney stones.   ALLERGIES:  None known.  No latex allergy.   MEDICATIONS:  1. Alendronate 70 mg once weekly.  2. Celebrex one p.o. as needed.  3. Naproxen 200 one p.o. as needed.  4. Ranitidine one p.o. as needed.  5. Prilosec one p.o. as needed.  6. Multivitamin one p.o. daily.  7. Zanaflex one p.o. daily.  8. Calcium one p.o. daily.  9. Vitamin D one p.o. daily.   FAMILY HISTORY:  Diabetes, heart disease, and her father had liver  cancer secondary to hepatitis C.   SOCIAL HISTORY:  She lives with her husband.  She has one grandchild.  She is not a smoker.  She does drink alcohol once or twice a year.   OBSTETRICAL HISTORY:  G2, P2, one C-section with first baby and then  VBAC followed by BTL.   GYNECOLOGIC HISTORY:  Last Pap January 2008.  She has had a history of  abnormal Pap __________.  She had a colonoscopy in 2005 or 2006.   REVIEW OF SYSTEMS:  Reviewed is diffusely positive.  Please see GYN  history in the chart.  She complains of some swelling in her legs and  ankles, some muscle aches, fatigue, dizzy spells, problems with hearing,  problems  with smell, problems with vision, some chest pain.  She has  what sounds to be urge incontinence.  She does not lose urine with  coughing or sneezing, but when she has to go, she has to go now or she  will lose her urine.  She complains of hot flashes still occasionally,  maybe once every couple of weeks and painful intercourse was a long-term  problem of her's.  This lady's problems are followed by her primary care  physician.   PHYSICAL EXAMINATION:  VITALS:  As noted in the chart.  GENERAL:  She is a well-developed, well-nourished female in no acute  distress.  HEENT:  Normocephalic, atraumatic.  Sclerae anicteric.  NECK:  Supple.  Normal thyroid.  LUNGS:  Clear bilaterally.  CARDIOVASCULAR:  Regular rate and rhythm without rubs, gallops, or  murmurs.  ABDOMEN:  Soft, nontender, nondistended.  EXTREMITIES:  No cyanosis, clubbing.  Trace pedal edema.  BREASTS:  Symmetric with  everted nipples.  No masses.  No  supraclavicular or axillary adenopathy.  GENITOURINARY:  Normal external female genitalia.  BUS normal.  Vagina  is pale with loss of rugation.  Cervix is small.  Uterus is small,  anteverted.  No adnexal mass or tenderness.   IMPRESSION:  Gynecologic exam with Pap smear.   PLAN:  1. Pap smear today.  2. Mammogram.  3. The patient did desire blood work; however, I feel like this would      best be served by her primary care physician who also follows her      for other issues.  If something were to come back, he will be the      one who has to follow these problems for her.  She will make an      appointment with him for blood work and will follow up with [place].           ______________________________  Tinnie Gens, MD     TP/MEDQ  D:  07/26/2008  T:  07/27/2008  Job:  [id number]

## 2010-10-17 NOTE — Assessment & Plan Note (Signed)
[facility]                             PULMONARY OFFICE NOTE   Martha Meyer, Martha Meyer                         MRN:          [id number]  DATE:05/13/2006                            DOB:          [birth year]    HISTORY OF PRESENT ILLNESS:  Patient is a 60 year old white female who I  have been asked to see for management of obstructive sleep apnea.  Patient was diagnosed approximately 10 years ago, and was placed on the  CPAP but was having great difficulty with wearing it on a consistent  basis.  She recently underwent a new study in April of this year in  [place] where she was found to have 20 other obstructive events in  the first 133 minutes of sleep.  This gave her a respiratory disturbance  index of approximately 13 events per hour.  Patient was placed on CPAP  and ultimately titrated to 11 cm of pressure.  She seemed to have good  control between 9:00 and 11:00.  She is also noted to have 60 leg jerks  with 11 per hour noted.  It was unclear whether these were associated  with arousal or awakening.  The patient currently is on CPAP at 11 cm  where she uses a full face mask.  She does have a heated humidifier, and  wears this every night.  Typically goes to bed at 10:00 and gets up  between 7:30 and 8:00, and is fairly rested whenever she arises.  She  feels that her alertness level has greatly improved, but she still has  an occasional episode where she gets fuzzy with decreased level of  focusing.  She has seen significant improvement in her memory since  being on the CPAP, but it still continues to be an issue for her.  Patient does describe some, like, bouncing during the evening hours, but  denies classic restless leg symptoms.  She does not think that she kicks  at night, but she has not asked her husband specifically.  Of note, the  patient states that her weight is up 6 pounds since her last sleep  study.   PAST MEDICAL HISTORY:  1.  Hypertension.  2. History of nephrolithiasis.  3. Status post tubal ligation.  4. Migraines.   CURRENT MEDICATIONS:  1. Actonel 35 mg 1 per week.  2. Multivitamin every day.  3. Prilosec p.r.n.   ALLERGIES:  NO KNOWN DRUG ALLERGIES   SOCIAL HISTORY:  She is married and has children.  She has never smoked.   FAMILY HISTORY:  Remarkable for her mother having had heart disease, and  her father had liver cancer and hepatitis C.   REVIEW OF SYSTEMS:  As per history of present illness.  Also see patient  intake form documented on the chart.   PHYSICAL EXAMINATION:  GENERAL:  She is an overweight, black female, no  acute distress.  Blood pressure is 110/80.  Pulse is 95.  Temperature is  98.5.  Weight is 184 pounds.  HEENT: Pupils are equal, round, and reactive to light and accommodation.  Extraocular  muscles are intact.  Nares are patent without discharge with  mild deviation to the left.  Oropharynx shows mild elongation of soft  palate and uvula.  NECK:  Supple without JVD or lymphadenopathy.  No palpable thyromegaly.  CHEST:  Totally clear.  CARDIO:  Reveals regular rate and rhythm.  No murmurs, rubs, or gallops.  ABDOMEN:  Soft, nontender, with good bowel sounds.  GENITAL EXAM, RECTAL EXAM, BREAST EXAM:  Not done and not indicated.  EXTREMITIES: Lower extremities are without edema, pulses are intact  distally.  Neurologically alert and oriented with no obvious motor deficits.   IMPRESSION:  History of mild obstructive sleep apnea with decreased  daytime alertness as well as memory issues.  The patient has been  wearing CPAP consistently since April and feels that she is much  improved, but still has some very low-grade symptoms.  It is unclear  whether this is related to her sleep apnea or some other sleep disorder.  Also, this may be related to something else entirely.  I think at this  point in time that she needs to stay on CPAP at her current level, and  also to work  aggressively on weight loss.  I have also asked her to keep  in mind the possibility of the restless leg syndrome with periodic leg  movements.   PLAN:  1. Work on weight loss.  2. I have asked her to increase the heat on her humidifier to get more      humidity to her upper airway.  This will help with mouth dryness      and nasal mucosal bleeding.  3. I have asked her to ask her husband specifically whether she kicks      a lot during the night, and to keep in mind the restless leg      syndrome in the evening while watching TV.  She will let me know if      this is more of an issue than what she thinks it is.  4. The patient will followup in 6 months or sooner if there are      problems.     Barbaraann Share, MD,FCCP  Electronically Signed    [facility]/MedQ  DD: 05/13/2006  DT: 05/13/2006  Job #: [id number]   cc:   Karie Schwalbe, MD

## 2010-12-11 ENCOUNTER — Encounter: Payer: Self-pay | Admitting: Internal Medicine

## 2010-12-12 ENCOUNTER — Encounter: Payer: Self-pay | Admitting: Internal Medicine

## 2010-12-12 ENCOUNTER — Ambulatory Visit (INDEPENDENT_AMBULATORY_CARE_PROVIDER_SITE_OTHER): Payer: BC Managed Care – PPO | Admitting: Internal Medicine

## 2010-12-12 VITALS — BP 140/80 | HR 90 | Temp 98.7°F | Ht 63.0 in | Wt 191.0 lb

## 2010-12-12 DIAGNOSIS — G4733 Obstructive sleep apnea (adult) (pediatric): Secondary | ICD-10-CM

## 2010-12-12 DIAGNOSIS — M109 Gout, unspecified: Secondary | ICD-10-CM

## 2010-12-12 DIAGNOSIS — I10 Essential (primary) hypertension: Secondary | ICD-10-CM

## 2010-12-12 MED ORDER — COLCHICINE 0.6 MG PO TABS: 0.6000 mg | ORAL_TABLET | Freq: Every day | ORAL | Status: DC

## 2010-12-12 NOTE — Assessment & Plan Note (Signed)
BP Readings from Last 3 Encounters:  12/12/10 140/80  06/09/10 142/98  01/07/10 120/84   BP is some better No meds for now

## 2010-12-12 NOTE — Assessment & Plan Note (Signed)
She will work with company about a new mask Refer prn

## 2010-12-12 NOTE — Progress Notes (Signed)
Subjective:    Patient ID: Martha Meyer, female    DOB: 1950-10-16, 60 y.o.   MRN: [id number]  HPI Doing okay May be having some trouble with her CPAP mask Tries to keep it as loose as possible Gets sore area between eyes---gets bad headache and nausea Has had the mask for about 1 year---full mask and has always leaked around her eyes Not sure if nasal pillows would work for her---mouth breather  Remains on the allopurinol Still gets symptoms at times with ankle and foot pain Believes it is the gout  Had tried magnesium and potassium pills Noted more regular heart fluttering  Better off the meds Had been using for leg cramps  Hasn't been checking BP No tinnitus No chest pain now. Did have brief spell a month ago No SOB  Current Outpatient Prescriptions on File Prior to Visit  Medication Sig Dispense Refill  . allopurinol (ZYLOPRIM) 300 MG tablet Take 300 mg by mouth daily.        . Calcium Carbonate (CALCIUM 500 PO) Take 1 tablet by mouth daily.        . cholecalciferol (VITAMIN D) 1000 UNITS tablet Take 1,000 Units by mouth daily.        Marland Kitchen ibuprofen (ADVIL,MOTRIN) 200 MG tablet 2-3 tabs by mouth three times a day with food as needed for gout pain       . Multiple Vitamins-Minerals (ONE-A-DAY EXTRAS ANTIOXIDANT) CAPS Take 1 capsule by mouth. occasionally         Allergies  Allergen Reactions  . Bupropion Hcl     REACTION: more talkative/ agrressive  . Oxycodone-Acetaminophen     REACTION: n/v  . Paroxetine     REACTION: More talkative/agreesive    Past Medical History  Diagnosis Date  . Depression   . Diverticulosis of colon   . GERD (gastroesophageal reflux disease)   . Hypertension   . Osteoporosis   . Kidney stones   . Osteoarthritis   . Obstructive sleep apnea   . Migraines   . Gout     Past Surgical History  Procedure Date  . Stone extraction with basket 6/05    Storoff  . Tubal ligation 1990's  . Cesarean section     x 1  . Vaginal delivery    x 1  . Esophagogastroduodenoscopy 3/07    stricture dilated  . Total hip arthroplasty 12/10    R, Dr Despina Hick    Family History  Problem Relation Age of Onset  . Hepatitis Father     Hep C  . Hypertension Father   . Heart attack Mother   . Diabetes Mother   . Coronary artery disease Paternal Grandfather   . Heart failure Paternal Grandfather   . Coronary artery disease Mother   . Heart failure Mother   . Hypertension Mother     ?  . Diabetes Maternal Grandmother   . Breast cancer Paternal Grandmother   . Colon cancer Neg Hx     History   Social History  . Marital Status: Married    Spouse Name: N/A    Number of Children: 2  . Years of Education: N/A   Occupational History  . Embroidering work- Air traffic controller    Social History Main Topics  . Smoking status: Never Smoker   . Smokeless tobacco: Not on file  . Alcohol Use: Yes     occ  . Drug Use: Not on file  . Sexually Active: Not on file  Other Topics Concern  . Not on file   Social History Narrative  . No narrative on file   Review of Systems Weight is about the same Appetite is okay No exercise due to foot pain    Objective:   Physical Exam  Constitutional: She appears well-developed and well-nourished. No distress.  Neck: Normal range of motion. Neck supple. No thyromegaly present.  Cardiovascular: Normal rate, regular rhythm and normal heart sounds.  Exam reveals no gallop.   No murmur heard. Pulmonary/Chest: Effort normal and breath sounds normal. No respiratory distress. She has no wheezes. She has no rales.  Musculoskeletal: Normal range of motion. She exhibits no edema.       No active inflammation in feet  Lymphadenopathy:    She has no cervical adenopathy.  Psychiatric: She has a normal mood and affect. Her behavior is normal. Judgment and thought content normal.          Assessment & Plan:

## 2010-12-12 NOTE — Patient Instructions (Signed)
Please try to get a new mask for your CPAP. If you cannot get a comfortable fit, call for referral to sleep specialist Please try the colchicine daily for the next few weeks. If your foot pain persists, please call for an appointment with Dr Patsy Lager to evaluate your foot pain

## 2010-12-12 NOTE — Assessment & Plan Note (Signed)
Not clear her ongoing foot pain is gout, but is possible May be mechanical It is limiting her from exercising Will try colchicine for gout daily---if persistent problems, eval by Dr Patsy Lager

## 2011-01-01 ENCOUNTER — Ambulatory Visit (INDEPENDENT_AMBULATORY_CARE_PROVIDER_SITE_OTHER): Payer: BC Managed Care – PPO | Admitting: Family Medicine

## 2011-01-01 ENCOUNTER — Encounter: Payer: Self-pay | Admitting: Family Medicine

## 2011-01-01 VITALS — BP 120/78 | HR 85 | Temp 98.3°F | Ht 61.5 in | Wt 189.0 lb

## 2011-01-01 DIAGNOSIS — M76829 Posterior tibial tendinitis, unspecified leg: Secondary | ICD-10-CM

## 2011-01-01 DIAGNOSIS — M109 Gout, unspecified: Secondary | ICD-10-CM

## 2011-01-01 DIAGNOSIS — M7670 Peroneal tendinitis, unspecified leg: Secondary | ICD-10-CM

## 2011-01-01 DIAGNOSIS — M7750 Other enthesopathy of unspecified foot: Secondary | ICD-10-CM

## 2011-01-01 MED ORDER — DICLOFENAC SODIUM 1 % TD GEL: TRANSDERMAL | Status: DC

## 2011-01-01 NOTE — Progress Notes (Signed)
Martha Meyer, a 60 y.o. female presents today in the office for the following:    Dr. Alphonsus Sias requested a consult for evaluation of foot pain:  Pain is mostly in the left foot. She has had a long-standing history of foot pain, and has seen multiple physicians in the past, both podiatrist as well as Dr. Lestine Box, the foot and ankle surgeon from [facility] orthopedics. Saw Dr. Suzette Battiest, one of the podiatrists most recently.  Has had an xray and u/s - at his office. It sounds as if she had an ankle effusion at the time, and he did what was likely a corticosteroid injection of her true ankle joint, and heat she has some resolution of probable gout. There is no history of aspiration of crystals to my knowledge. Taking Allopurinol. She was on Uloric in the past. She recently started  colchicine daily for gout prophylaxis and is doing well with that  Has had some high uric acid levels in the past Has also seen Dr. Lestine Box  She has had 2 sets of what sounds like some rigid orthotics made in the past, but they have not been very comfortable. Right now she is using some SAS shoes with an additional Dr. Margart Sickles over-the-counter insert, which seem to be helping.  Actually feeling a little better right now  The PMH, PSH, Social History, Family History, Medications, and allergies have been reviewed in [facility], and have been updated if relevant.  REVIEW OF SYSTEMS  GEN: No fevers, chills. Nontoxic. Primarily MSK c/o today. MSK: Detailed in the HPI GI: tolerating PO intake without difficulty Neuro: No numbness, parasthesias, or tingling associated. Otherwise the pertinent positives of the ROS are noted above.    Physical Exam  Blood pressure 120/78, pulse 85, temperature 98.3 F (36.8 C), temperature source Oral, height 5' 1.5" (1.562 m), weight 189 lb (85.73 kg), SpO2 98.00%.  GEN: WDWN, NAD, Non-toxic, A & O x 3 HEENT: Atraumatic, Normocephalic. Neck supple. No masses, No LAD. Ears and Nose: No  external deformity. EXTR: No c/c/e NEURO Normal gait.  PSYCH: Normally interactive. Conversant. Not depressed or anxious appearing.  Calm demeanor.   Feet: note significant bilateral longitudinal arch collapse. Significant transverse arch collapse with significant toe splaying, bunionette formation, and some mild hallux rigidus. Hindfoot has minimal to no breakdown. LEFT side, there is significant posterior tibialis and peroneal tendon pain to palpation. Nontender throughout all Achilles. Nontender along the plantar fascia. Nontender with deep palpation of the bony anatomy throughout the midfoot and forefoot. Nontender at the ankle. Stabler anterior cord subtalar tilt testing. Strength is preserved at 5/5. Pronation significantly on gait.  Assessment and Plan: 1. GOUT   2. Tibialis posterior tendinitis   3. Peroneal tendonitis    Recommendations:  Multifactorial foot pain. The patient seems to be doing well with her new gout regimen.  I think that she would probably do best in a cushioned material rather than hard orthotics. She seems to like her over-the-counter SAS shoes, and along with the over-the-counter orthotic, think that is a good idea.  Thank you working on balance and proprioception would be helpful. We reviewed some basic proprioceptive techniques, and I think it generally strengthening her peroneals, posterior tibialis musculature as well as her calves would likely help. I think that she is challenging foot breakdown issues, and works on her feet a lot, and there is no one solution that will make her completely symptom-free. Anything that helps is a positive.

## 2011-01-01 NOTE — Patient Instructions (Addendum)
Posterior Tib and arch rehab Begin with easy walking, heel, toe and backwards * Try to pick an easy location like a hallway or a room in your house and do one of these each time that you go through this area. Start out very easy  Calf raises on a step -  Try to do most days of the week For you, do on the flat ground Start out VERY EASY  Towel "Scrunch Ups" Use a hand towel or a moderate size towel Foot flat down on the towel Use toes to "scrunch up the towel" straight up and down, and going to the right and left.  3 sets of 20 * Can be done watching TV, reading, or sitting and relaxing.  Towel - "Windshield wipers" Move ankle from the left to the right. 2 sets of 20  Balance on 1 foot while brushing teeth Try to 30 seconds When you can do this, then try while closing eyes  YOU CAN ALSO USE AN ICE CUBE TO MASSAGE THE SORE AREAS OR PUT YOUR FOOT IN AN ICE WATER BATH FOR 15 MINUTES

## 2011-01-05 ENCOUNTER — Other Ambulatory Visit: Payer: Self-pay | Admitting: *Deleted

## 2011-01-06 ENCOUNTER — Telehealth: Payer: Self-pay | Admitting: *Deleted

## 2011-01-06 ENCOUNTER — Encounter: Payer: Self-pay | Admitting: Family Medicine

## 2011-01-06 DIAGNOSIS — M19079 Primary osteoarthritis, unspecified ankle and foot: Secondary | ICD-10-CM | POA: Insufficient documentation

## 2011-01-06 NOTE — Telephone Encounter (Signed)
Prior auth is needed for voltaren gel, form is on your desk. 

## 2011-01-06 NOTE — Telephone Encounter (Signed)
done

## 2011-01-12 NOTE — Telephone Encounter (Signed)
Prior auth given for voltaren gel, advised pharmacy.  Approval letter placed on doctor's desk for signature and scanning. 

## 2011-03-09 ENCOUNTER — Encounter: Payer: Self-pay | Admitting: Pulmonary Disease

## 2011-03-09 ENCOUNTER — Ambulatory Visit (INDEPENDENT_AMBULATORY_CARE_PROVIDER_SITE_OTHER): Payer: BC Managed Care – PPO | Admitting: Pulmonary Disease

## 2011-03-09 VITALS — BP 136/88 | HR 92 | Temp 97.8°F | Ht 61.5 in | Wt 191.2 lb

## 2011-03-09 DIAGNOSIS — G4733 Obstructive sleep apnea (adult) (pediatric): Secondary | ICD-10-CM

## 2011-03-09 NOTE — Patient Instructions (Signed)
Continue with cpap Have your DME show you different masks.  Check out the [facility] FX full face.  May need children size as an option.  Work on weight loss followup with me in one year.

## 2011-03-09 NOTE — Progress Notes (Signed)
  Subjective:    Patient ID: Martha Meyer, female    DOB: August 29, 1950, 60 y.o.   MRN: [id number]  HPI Patient comes in today for followup of her known obstructive sleep apnea.  She is wearing CPAP compliantly, and is having no issues with pressure.  Her mask however, is not fitting well, and she is having to pull the straps tight in order to prevent leaks.  This also is uncomfortable on her face.  She also is having sleep disruption because of musculoskeletal complaints, and also a snoring husband.  Her weight is neutral from her last visit.   Review of Systems  Constitutional: Negative for fever and unexpected weight change.  HENT: Positive for congestion. Negative for ear pain, nosebleeds, sore throat, rhinorrhea, sneezing, trouble swallowing, dental problem, postnasal drip and sinus pressure.   Eyes: Negative for redness and itching.  Respiratory: Negative for cough, chest tightness, shortness of breath and wheezing.   Cardiovascular: Negative for palpitations and leg swelling.  Gastrointestinal: Negative for nausea and vomiting.  Genitourinary: Negative for dysuria.  Musculoskeletal: Negative for joint swelling.  Skin: Negative for rash.  Neurological: Negative for headaches.  Hematological: Does not bruise/bleed easily.  Psychiatric/Behavioral: Negative for dysphoric mood. The patient is not nervous/anxious.        Objective:   Physical Exam Overweight female in no acute distress Nose without purulence or discharge noted No skin breakdown or pressure necrosis from the CPAP mask Lower extremities without significant edema, no cyanosis Alert and oriented, moves all 4 extremities.  Does not appear sleepy during the visit       Assessment & Plan:

## 2011-03-09 NOTE — Assessment & Plan Note (Signed)
The patient is wearing CPAP compliantly, and overall feels that she is sleeping well with the device.  She is still having some sleep disruption from musculoskeletal issues, and her snoring husband.  I have asked her to continue with CPAP, to try different full face masks, and to work aggressively on weight loss.  She is to followup with me in one year.

## 2011-03-25 ENCOUNTER — Telehealth: Payer: Self-pay | Admitting: Pulmonary Disease

## 2011-03-25 DIAGNOSIS — G4733 Obstructive sleep apnea (adult) (pediatric): Secondary | ICD-10-CM

## 2011-03-25 NOTE — Telephone Encounter (Signed)
Called and spoke with pt. Pt states she recently saw [facility] on 03/09/11 and he recommended she see her DME company and have them show her different masks.  Pt states she contacted her DME company, [employer], and was informed they could not even show pt any masks without an order.  Therefore sent order to [facility] for DME referral.

## 2011-03-25 NOTE — Telephone Encounter (Signed)
LMOMTCB x 1 

## 2011-05-01 ENCOUNTER — Other Ambulatory Visit: Payer: Self-pay | Admitting: Internal Medicine

## 2011-05-20 ENCOUNTER — Ambulatory Visit: Payer: Self-pay

## 2011-06-16 ENCOUNTER — Encounter: Payer: BC Managed Care – PPO | Admitting: Internal Medicine

## 2011-09-10 ENCOUNTER — Encounter: Payer: Self-pay | Admitting: Internal Medicine

## 2011-09-10 ENCOUNTER — Ambulatory Visit (INDEPENDENT_AMBULATORY_CARE_PROVIDER_SITE_OTHER): Payer: BC Managed Care – PPO | Admitting: Internal Medicine

## 2011-09-10 VITALS — BP 148/88 | HR 90 | Temp 98.7°F | Ht 61.0 in | Wt 189.0 lb

## 2011-09-10 DIAGNOSIS — M109 Gout, unspecified: Secondary | ICD-10-CM

## 2011-09-10 DIAGNOSIS — I10 Essential (primary) hypertension: Secondary | ICD-10-CM

## 2011-09-10 DIAGNOSIS — Z1231 Encounter for screening mammogram for malignant neoplasm of breast: Secondary | ICD-10-CM

## 2011-09-10 DIAGNOSIS — Z0000 Encounter for general adult medical examination without abnormal findings: Secondary | ICD-10-CM | POA: Insufficient documentation

## 2011-09-10 NOTE — Assessment & Plan Note (Signed)
Really out of shape Discussed at least trying exercise bike---lots of excuses Will get mammo

## 2011-09-10 NOTE — Assessment & Plan Note (Signed)
BP Readings from Last 3 Encounters:  09/10/11 148/88  03/09/11 136/88  01/01/11 120/78   Not consistently up  Will have her work on lifestyle and consider Rx next time

## 2011-09-10 NOTE — Progress Notes (Signed)
Subjective:    Patient ID: Martha Meyer, female    DOB: 18-Dec-1950, 61 y.o.   MRN: [id number]  HPI Doing well Has plans for gyn again at [facility]---appt pending  Sick on Monday---3 days ago Dry heaves, nausea, fatigue, fever,dizziness, abd pain---has improved now  No regular exercise States this is due to foot pain No access to pool---discussed stationery bike Tries to watch diet---has stopped sodas Weight stable  No clear gout flares Not sure if that is why her feet help--it is on bottom of feet  Mood has been okay  Current Outpatient Prescriptions on File Prior to Visit  Medication Sig Dispense Refill  . allopurinol (ZYLOPRIM) 300 MG tablet TAKE ONE TABLET BY MOUTH DAILY  90 tablet  3  . Calcium Carbonate (CALCIUM 500 PO) Take 1 tablet by mouth daily.        . cholecalciferol (VITAMIN D) 1000 UNITS tablet Take 1,000 Units by mouth daily.        . colchicine (COLCRYS) 0.6 MG tablet Take 1 tablet (0.6 mg total) by mouth daily.  30 tablet  11  . ibuprofen (ADVIL,MOTRIN) 200 MG tablet 2-3 tabs by mouth three times a day with food as needed for gout pain       . Multiple Vitamins-Minerals (ONE-A-DAY EXTRAS ANTIOXIDANT) CAPS Take 1 capsule by mouth. occasionally         Allergies  Allergen Reactions  . Bupropion Hcl     REACTION: more talkative/ agrressive  . Oxycodone-Acetaminophen     REACTION: n/v  . Paroxetine     REACTION: More talkative/agreesive    Past Medical History  Diagnosis Date  . Depression   . Diverticulosis of colon   . GERD (gastroesophageal reflux disease)   . Hypertension   . Osteoporosis   . Kidney stones   . Osteoarthritis   . Obstructive sleep apnea   . Migraines   . Gout     Past Surgical History  Procedure Date  . Stone extraction with basket 6/05    Storoff  . Tubal ligation 1990's  . Cesarean section     x 1  . Vaginal delivery     x 1  . Esophagogastroduodenoscopy 3/07    stricture dilated  . Total hip arthroplasty 12/10      R, Dr Despina Hick    Family History  Problem Relation Age of Onset  . Hepatitis Father     Hep C  . Hypertension Father   . Heart attack Mother   . Diabetes Mother   . Coronary artery disease Paternal Grandfather   . Heart failure Paternal Grandfather   . Coronary artery disease Mother   . Heart failure Mother   . Hypertension Mother     ?  . Diabetes Maternal Grandmother   . Breast cancer Paternal Grandmother   . Colon cancer Neg Hx     History   Social History  . Marital Status: Married    Spouse Name: N/A    Number of Children: 2  . Years of Education: N/A   Occupational History  . Embroidering work- Air traffic controller    Social History Main Topics  . Smoking status: Never Smoker   . Smokeless tobacco: Never Used  . Alcohol Use: Yes     occ  . Drug Use: Not on file  . Sexually Active: Not on file   Other Topics Concern  . Not on file   Social History Narrative  . No narrative on file  Review of Systems  Constitutional: Negative for fatigue and unexpected weight change.       Wears seat belt  HENT: Positive for hearing loss and tinnitus. Negative for rhinorrhea, dental problem and postnasal drip.        Has new hearing aides Regular with dentist  Eyes: Negative for visual disturbance.       No diplopia or unilateral vision loss Vision seems worse at night Early cataract  Respiratory: Positive for shortness of breath. Negative for chest tightness and stridor.        DOE with fast walk or up stairs   Cardiovascular: Positive for palpitations. Negative for chest pain and leg swelling.       Occ brief skip---not recently. Races with going up steps at times  Gastrointestinal: Negative for nausea, vomiting, abdominal pain, constipation and blood in stool.       Rare heartburn if she "overdoes it"----rarely takes zantac  Genitourinary: Positive for urgency and frequency. Negative for dysuria and dyspareunia.       Mild vaginal dryness  Musculoskeletal:  Positive for back pain and arthralgias. Negative for joint swelling.       Gets some wrist pain from work [employer] tylenol first, then ibuprofen only if necessary Chronic back problems  Skin: Negative for rash.       No suspicious areas  Neurological: Positive for dizziness. Negative for syncope, weakness, light-headedness, numbness and headaches.  Hematological: Negative for adenopathy. Does not bruise/bleed easily.  Psychiatric/Behavioral: Positive for sleep disturbance. Negative for dysphoric mood. The patient is nervous/anxious.        Up periodically at night Still uses CPAP Stress at work       Objective:   Physical Exam  Constitutional: She is oriented to person, place, and time. She appears well-developed and well-nourished. No distress.  HENT:  Head: Normocephalic and atraumatic.  Right Ear: External ear normal.  Left Ear: External ear normal.  Mouth/Throat: Oropharynx is clear and moist. No oropharyngeal exudate.  Eyes: Conjunctivae and EOM are normal. Pupils are equal, round, and reactive to light.  Neck: Normal range of motion. Neck supple. No thyromegaly present.  Cardiovascular: Normal rate, regular rhythm, normal heart sounds and intact distal pulses.  Exam reveals no gallop.   No murmur heard. Pulmonary/Chest: Effort normal and breath sounds normal. No respiratory distress. She has no wheezes. She has no rales.  Abdominal: Soft. There is no tenderness.       obese  Genitourinary:       Mild cystic changes in each breast  Musculoskeletal: She exhibits no edema and no tenderness.       No gouty lesions  Lymphadenopathy:    She has no cervical adenopathy.    She has no axillary adenopathy.  Neurological: She is alert and oriented to person, place, and time.  Skin: No rash noted. No erythema.  Psychiatric: She has a normal mood and affect. Her behavior is normal. Thought content normal.          Assessment & Plan:

## 2011-09-10 NOTE — Assessment & Plan Note (Signed)
Doesn't seem to be active Check uric acid

## 2011-09-11 LAB — BASIC METABOLIC PANEL
Calcium: 9.5 mg/dL (ref 8.4–10.5)
GFR: 90.81 mL/min (ref >60.00)
Glucose, Bld: 74 mg/dL (ref 70–99)
Sodium: 141 mEq/L (ref 135–145)

## 2011-09-11 LAB — HEPATIC FUNCTION PANEL
ALT: 26 U/L (ref 0–35)
Total Bilirubin: 0.8 mg/dL (ref 0.3–1.2)
Total Protein: 7.2 g/dL (ref 6.0–8.3)

## 2011-09-11 LAB — CBC WITH DIFFERENTIAL/PLATELET
Eosinophils Relative: 1.3 % (ref 0.0–5.0)
HCT: 47 % — ABNORMAL HIGH (ref 36.0–46.0)
Lymphs Abs: 2.3 10*3/uL (ref 0.7–4.0)
MCHC: 33.2 g/dL (ref 30.0–36.0)
MCV: 95.6 fl (ref 78.0–100.0)
Monocytes Absolute: 0.7 10*3/uL (ref 0.1–1.0)
Platelets: 255 10*3/uL (ref 150.0–400.0)
RDW: 13.7 % (ref 11.5–14.6)
WBC: 9.5 10*3/uL (ref 4.5–10.5)

## 2011-09-11 LAB — LIPID PANEL
Cholesterol: 204 mg/dL — ABNORMAL HIGH (ref 0–200)
Total CHOL/HDL Ratio: 4
Triglycerides: 151 mg/dL — ABNORMAL HIGH (ref 0.0–149.0)

## 2011-09-14 ENCOUNTER — Encounter: Payer: Self-pay | Admitting: *Deleted

## 2011-10-16 ENCOUNTER — Ambulatory Visit: Payer: Self-pay | Admitting: Internal Medicine

## 2011-10-19 ENCOUNTER — Encounter: Payer: Self-pay | Admitting: *Deleted

## 2012-03-08 ENCOUNTER — Ambulatory Visit (INDEPENDENT_AMBULATORY_CARE_PROVIDER_SITE_OTHER): Payer: BC Managed Care – PPO | Admitting: Pulmonary Disease

## 2012-03-08 ENCOUNTER — Encounter: Payer: Self-pay | Admitting: Pulmonary Disease

## 2012-03-08 VITALS — BP 160/100 | HR 88 | Ht 60.5 in | Wt 195.8 lb

## 2012-03-08 DIAGNOSIS — G4733 Obstructive sleep apnea (adult) (pediatric): Secondary | ICD-10-CM

## 2012-03-08 NOTE — Patient Instructions (Addendum)
Will see about getting you a new machine.  Either way, will re-optimize your pressure with the auto setting for the next 2 weeks.  Will let you know the results. Can try nasal pillows with chin strap, but you have to be able to keep your mouth closed during sleep Work on weight loss followup with me in one year if doing well.  If not, please call to let us know.

## 2012-03-08 NOTE — Progress Notes (Signed)
  Subjective:    Patient ID: Martha Meyer, female    DOB: Mar 05, 1951, 62 y.o.   MRN: [id number]  HPI The patient comes in today for followup of her known objective sleep apnea.  She's been wearing CPAP compliantly, but is continuing to have issues with nonrestorative sleep and some degree of daytime sleepiness.  She blames part of this on her husband's mild snoring, as well as chronic pain issues.  She feels that her mask fits adequately, but would like to try nasal pillows.  Her machine is over 23 years old, and she is unsure if it is working properly.  She has also gained some weight since the last visit.   Review of Systems  Constitutional: Negative for fever and unexpected weight change.  HENT: Positive for congestion. Negative for ear pain, nosebleeds, sore throat, rhinorrhea, sneezing, trouble swallowing, dental problem, postnasal drip and sinus pressure.   Eyes: Negative for redness and itching.  Respiratory: Positive for cough ( due to gerd ) and shortness of breath. Negative for chest tightness and wheezing.   Cardiovascular: Negative for palpitations and leg swelling.  Gastrointestinal: Negative for nausea and vomiting.  Genitourinary: Negative for dysuria.  Musculoskeletal: Negative for joint swelling.  Skin: Negative for rash.  Neurological: Negative for headaches.  Hematological: Bruises/bleeds easily.  Psychiatric/Behavioral: Negative for dysphoric mood. The patient is not nervous/anxious.        Objective:   Physical Exam Obese female in no acute distress Nose without purulence or discharge noted No skin breakdown or pressure necrosis from the CPAP mask Neck without lymphadenopathy or thyromegaly Chest clear to auscultation Cardiac exam is regular rate and rhythm Lower extremities with no edema, no cyanosis Alert, does not appear to be overly sleepy, moves all 4 extremities.        Assessment & Plan:

## 2012-03-08 NOTE — Assessment & Plan Note (Signed)
The patient is wearing CPAP compliantly, and feels that she is doing well with tolerance.  Despite this, she is having nonrestorative sleep and some degree of daytime sleepiness.  It is unclear if her machine is working properly, if she is at the appropriate pressure, or if a lot of her sleep disruption it is simply environmental issues with her husband's snoring and chronic pain.  I would like to get her a new CPAP machine if she is eligible, and also optimize her pressure again on the automatic setting.  I've also encouraged her to work aggressively on weight loss.

## 2012-03-11 ENCOUNTER — Ambulatory Visit: Payer: BC Managed Care – PPO | Admitting: Internal Medicine

## 2012-03-28 ENCOUNTER — Ambulatory Visit (INDEPENDENT_AMBULATORY_CARE_PROVIDER_SITE_OTHER): Payer: BC Managed Care – PPO | Admitting: Internal Medicine

## 2012-03-28 ENCOUNTER — Encounter: Payer: Self-pay | Admitting: Internal Medicine

## 2012-03-28 VITALS — BP 148/90 | HR 89 | Temp 98.1°F | Ht 61.0 in | Wt 195.0 lb

## 2012-03-28 DIAGNOSIS — I10 Essential (primary) hypertension: Secondary | ICD-10-CM

## 2012-03-28 DIAGNOSIS — M79671 Pain in right foot: Secondary | ICD-10-CM

## 2012-03-28 DIAGNOSIS — M79672 Pain in left foot: Secondary | ICD-10-CM

## 2012-03-28 DIAGNOSIS — M109 Gout, unspecified: Secondary | ICD-10-CM

## 2012-03-28 DIAGNOSIS — M79609 Pain in unspecified limb: Secondary | ICD-10-CM

## 2012-03-28 MED ORDER — LOSARTAN POTASSIUM 50 MG PO TABS: 50.0000 mg | ORAL_TABLET | Freq: Every day | ORAL | Status: DC

## 2012-03-28 NOTE — Progress Notes (Signed)
Subjective:    Patient ID: Martha Meyer, female    DOB: August 30, 1950, 61 y.o.   MRN: [id number]  HPI Working on CPAP machine with Dr Shelle Iron Hadn't had great response from this treatment exhausted by the end of the day--relates to problems with her foot pain Plantar surface gets sore and burns at work By end of work day, will hurt on top of foot also Seeing Dr Leticia Penna, podiatrist. He has scraped part of plantar surface and may be some better He did biopsy and then sent to Dr Anne Hahn last week--probably getting NCV study B12 was fine  Did check BP recently Was 150/100 or so No headaches No chest pain No SOB Did have spell of dizziness and dry heaves 2 days ago---went away on her own. Thinks the room may have been too hot  No gout problems On allopurinol regularly Hasn't needed colchicine  Current Outpatient Prescriptions on File Prior to Visit  Medication Sig Dispense Refill  . allopurinol (ZYLOPRIM) 300 MG tablet TAKE ONE TABLET BY MOUTH DAILY  90 tablet  3  . Calcium Carbonate (CALCIUM 500 PO) Take 1 tablet by mouth daily.        . cholecalciferol (VITAMIN D) 1000 UNITS tablet Take 1,000 Units by mouth daily.        Marland Kitchen ibuprofen (ADVIL,MOTRIN) 200 MG tablet 2-3 tabs by mouth three times a day with food as needed for gout pain       . Multiple Vitamins-Minerals (ONE-A-DAY EXTRAS ANTIOXIDANT) CAPS Take 1 capsule by mouth. occasionally       . DISCONTD: colchicine (COLCRYS) 0.6 MG tablet Take 1 tablet (0.6 mg total) by mouth daily.  30 tablet  11    Allergies  Allergen Reactions  . Bupropion Hcl     REACTION: more talkative/ agrressive  . Oxycodone-Acetaminophen     REACTION: n/v  . Paroxetine     REACTION: More talkative/agreesive    Past Medical History  Diagnosis Date  . Depression   . Diverticulosis of colon   . GERD (gastroesophageal reflux disease)   . Hypertension   . Osteoporosis   . Kidney stones   . Osteoarthritis   . Obstructive sleep apnea   . Migraines     . Gout     Past Surgical History  Procedure Date  . Stone extraction with basket 6/05    Storoff  . Tubal ligation 1990's  . Cesarean section     x 1  . Vaginal delivery     x 1  . Esophagogastroduodenoscopy 3/07    stricture dilated  . Total hip arthroplasty 12/10    R, Dr Despina Hick    Family History  Problem Relation Age of Onset  . Hepatitis Father     Hep C  . Hypertension Father   . Heart attack Mother   . Diabetes Mother   . Coronary artery disease Paternal Grandfather   . Heart failure Paternal Grandfather   . Coronary artery disease Mother   . Heart failure Mother   . Hypertension Mother     ?  . Diabetes Maternal Grandmother   . Breast cancer Paternal Grandmother   . Colon cancer Neg Hx     History   Social History  . Marital Status: Married    Spouse Name: N/A    Number of Children: 2  . Years of Education: N/A   Occupational History  . Embroidering work- Air traffic controller    Social History Main Topics  . Smoking status:  Never Smoker   . Smokeless tobacco: Never Used  . Alcohol Use: Yes     occ  . Drug Use: Not on file  . Sexually Active: Not on file   Other Topics Concern  . Not on file   Social History Narrative  . No narrative on file   Review of Systems Weight is stable Bowels are okay Voiding okay---some increased frequency (day and night). No dysuria    Objective:   Physical Exam  Constitutional: She appears well-developed and well-nourished. No distress.  Neck: Normal range of motion. Neck supple. No thyromegaly present.  Cardiovascular: Normal rate, regular rhythm, normal heart sounds and intact distal pulses.  Exam reveals no gallop.   No murmur heard. Pulmonary/Chest: Effort normal and breath sounds normal. No respiratory distress. She has no wheezes. She has no rales.  Musculoskeletal: She exhibits no edema.       No active synovitis  Lymphadenopathy:    She has no cervical adenopathy.  Skin:       Dry patches on plantar  feet but no lesions          Assessment & Plan:

## 2012-03-28 NOTE — Assessment & Plan Note (Signed)
Doesn't seem to be active Will continue current meds

## 2012-03-28 NOTE — Assessment & Plan Note (Signed)
BP Readings from Last 3 Encounters:  03/28/12 148/90  03/08/12 160/100  09/10/11 148/88   BP really went up just with her trying to get her socks off Will start low dose meds due to persistent elevations

## 2012-03-28 NOTE — Assessment & Plan Note (Signed)
No success with the podiatrist Now seeing Dr Anne Hahn to check for neuropathy Has burning character and has kept her from doing any exercise Will await his evaluation

## 2012-05-09 ENCOUNTER — Encounter: Payer: Self-pay | Admitting: Internal Medicine

## 2012-05-09 ENCOUNTER — Ambulatory Visit (INDEPENDENT_AMBULATORY_CARE_PROVIDER_SITE_OTHER): Payer: BC Managed Care – PPO | Admitting: Internal Medicine

## 2012-05-09 VITALS — BP 150/90 | HR 78 | Temp 98.0°F | Wt 197.0 lb

## 2012-05-09 DIAGNOSIS — I10 Essential (primary) hypertension: Secondary | ICD-10-CM

## 2012-05-09 MED ORDER — ALLOPURINOL 300 MG PO TABS: 300.0000 mg | ORAL_TABLET | Freq: Every day | ORAL | Status: DC

## 2012-05-09 MED ORDER — LOSARTAN POTASSIUM 100 MG PO TABS: 100.0000 mg | ORAL_TABLET | Freq: Every day | ORAL | Status: DC

## 2012-05-09 NOTE — Assessment & Plan Note (Signed)
BP Readings from Last 3 Encounters:  05/09/12 150/90  03/28/12 148/90  03/08/12 160/100   Repeat 138/94 on right Will increase the losartan to 100mg   Check met b

## 2012-05-09 NOTE — Progress Notes (Signed)
Subjective:    Patient ID: Martha Meyer, female    DOB: 04-29-1951, 61 y.o.   MRN: [id number]  HPI Here for BP follow up  No problems on the losartan Hasn't checked her BP at home----lost cuff  Has had some borderline headaches---nothing striking No chest pain No SOB Occ feels her heart is not regular--only lasts a few seconds. No associated symptoms No dizziness or syncope  Current Outpatient Prescriptions on File Prior to Visit  Medication Sig Dispense Refill  . Calcium Carbonate (CALCIUM 500 PO) Take 1 tablet by mouth daily.        . cholecalciferol (VITAMIN D) 1000 UNITS tablet Take 1,000 Units by mouth daily.        . colchicine 0.6 MG tablet Take 0.6 mg by mouth daily.      Marland Kitchen ibuprofen (ADVIL,MOTRIN) 200 MG tablet 2-3 tabs by mouth three times a day with food as needed for gout pain       . losartan (COZAAR) 50 MG tablet Take 1 tablet (50 mg total) by mouth daily.  30 tablet  5  . Multiple Vitamins-Minerals (ONE-A-DAY EXTRAS ANTIOXIDANT) CAPS Take 1 capsule by mouth. occasionally       . [DISCONTINUED] allopurinol (ZYLOPRIM) 300 MG tablet TAKE ONE TABLET BY MOUTH DAILY  90 tablet  3    Allergies  Allergen Reactions  . Bupropion Hcl     REACTION: more talkative/ agrressive  . Oxycodone-Acetaminophen     REACTION: n/v  . Paroxetine     REACTION: More talkative/agreesive    Past Medical History  Diagnosis Date  . Depression   . Diverticulosis of colon   . GERD (gastroesophageal reflux disease)   . Hypertension   . Osteoporosis   . Kidney stones   . Osteoarthritis   . Obstructive sleep apnea   . Migraines   . Gout     Past Surgical History  Procedure Date  . Stone extraction with basket 6/05    Storoff  . Tubal ligation 1990's  . Cesarean section     x 1  . Vaginal delivery     x 1  . Esophagogastroduodenoscopy 3/07    stricture dilated  . Total hip arthroplasty 12/10    R, Dr Despina Hick    Family History  Problem Relation Age of Onset  . Hepatitis  Father     Hep C  . Hypertension Father   . Heart attack Mother   . Diabetes Mother   . Coronary artery disease Paternal Grandfather   . Heart failure Paternal Grandfather   . Coronary artery disease Mother   . Heart failure Mother   . Hypertension Mother     ?  . Diabetes Maternal Grandmother   . Breast cancer Paternal Grandmother   . Colon cancer Neg Hx     History   Social History  . Marital Status: Married    Spouse Name: N/A    Number of Children: 2  . Years of Education: N/A   Occupational History  . Embroidering work- Air traffic controller    Social History Main Topics  . Smoking status: Never Smoker   . Smokeless tobacco: Never Used  . Alcohol Use: Yes     Comment: occ  . Drug Use: Not on file  . Sexually Active: Not on file   Other Topics Concern  . Not on file   Social History Narrative  . No narrative on file   Review of Systems Sleeping fairly well on the machine  Still not rested in AM but better than before she had CPAP. Now at 14     Objective:   Physical Exam  Constitutional: She appears well-developed and well-nourished. No distress.  Neck: Normal range of motion. Neck supple. No thyromegaly present.  Cardiovascular: Normal rate, regular rhythm, normal heart sounds and intact distal pulses.  Exam reveals no gallop.   No murmur heard. Pulmonary/Chest: Effort normal and breath sounds normal. No respiratory distress. She has no wheezes. She has no rales.  Musculoskeletal: She exhibits no edema.  Lymphadenopathy:    She has no cervical adenopathy.  Psychiatric: She has a normal mood and affect. Her behavior is normal.          Assessment & Plan:

## 2012-05-10 ENCOUNTER — Other Ambulatory Visit (INDEPENDENT_AMBULATORY_CARE_PROVIDER_SITE_OTHER): Payer: BC Managed Care – PPO

## 2012-05-10 LAB — BASIC METABOLIC PANEL
BUN: 14 mg/dL (ref 6–23)
CO2: 25 mEq/L (ref 19–32)
Chloride: 106 mEq/L (ref 96–112)
Creatinine, Ser: 0.8 mg/dL (ref 0.4–1.2)
Glucose, Bld: 106 mg/dL — ABNORMAL HIGH (ref 70–99)

## 2012-05-11 ENCOUNTER — Encounter: Payer: Self-pay | Admitting: *Deleted

## 2012-05-19 ENCOUNTER — Telehealth: Payer: Self-pay | Admitting: Pulmonary Disease

## 2012-05-19 DIAGNOSIS — G4733 Obstructive sleep apnea (adult) (pediatric): Secondary | ICD-10-CM

## 2012-05-19 NOTE — Telephone Encounter (Signed)
I spoke with the pt and she states as of 04-29-12 she no longer has [employer] and Rosann Auerbach is now her primary. [facility] sent an order for new machine on 03-08-2012 but pt states she did not get it at that time due to insurance changes. Pt wants order re-sent. Pt states she s ok with a different company if needed. Order placed. Pt is aware. Carron Curie, CMA

## 2012-05-19 NOTE — Telephone Encounter (Signed)
(  continued) Pt states that any correspondence will need to include Intake ID# [id number].  Pt states that [facility] ordered a new CPAP for her earlier this year, however, pt did not get the new CPAP at that time knowing she would be changing insurance companies.  Thanks.  Antionette Fairy

## 2012-05-27 ENCOUNTER — Telehealth: Payer: Self-pay | Admitting: Pulmonary Disease

## 2012-05-27 NOTE — Telephone Encounter (Signed)
LMTCB

## 2012-05-30 ENCOUNTER — Telehealth: Payer: Self-pay | Admitting: Pulmonary Disease

## 2012-05-30 NOTE — Telephone Encounter (Signed)
Pt decided she wanted to keep using a full face mask and [employer] have faxed over a form that needs [place] signature. This has been placed in [facility] look at folder so he may sign when he returns to the office on 06/02/12.

## 2012-05-30 NOTE — Telephone Encounter (Signed)
This has been noted in his chart.

## 2012-07-19 ENCOUNTER — Other Ambulatory Visit: Payer: Self-pay | Admitting: Family Medicine

## 2012-07-19 MED ORDER — ALLOPURINOL 300 MG PO TABS: 300.0000 mg | ORAL_TABLET | Freq: Every day | ORAL | Status: DC

## 2012-07-19 MED ORDER — LOSARTAN POTASSIUM 100 MG PO TABS: 100.0000 mg | ORAL_TABLET | Freq: Every day | ORAL | Status: DC

## 2012-07-19 NOTE — Telephone Encounter (Signed)
Pt requesting RX for losartan and allopurinol be sent to [facility] for insurance purposes.

## 2012-11-28 ENCOUNTER — Telehealth: Payer: Self-pay

## 2012-11-28 NOTE — Telephone Encounter (Signed)
Advised pt to have a light breakfast

## 2012-11-28 NOTE — Telephone Encounter (Signed)
Pt called back usually eats around 7 am; advised pt per CMA can eat light breakfast.pt voiced understanding.

## 2012-11-28 NOTE — Telephone Encounter (Signed)
Pt left v/m and has CPX on 11/30/12 at 9:30 am; should pt come fasting to appt for labs.Please advise.

## 2012-11-30 ENCOUNTER — Ambulatory Visit (INDEPENDENT_AMBULATORY_CARE_PROVIDER_SITE_OTHER): Payer: Managed Care, Other (non HMO) | Admitting: Internal Medicine

## 2012-11-30 ENCOUNTER — Encounter: Payer: Self-pay | Admitting: Internal Medicine

## 2012-11-30 VITALS — BP 128/82 | HR 88 | Temp 97.5°F | Ht 61.0 in | Wt 201.0 lb

## 2012-11-30 DIAGNOSIS — I10 Essential (primary) hypertension: Secondary | ICD-10-CM

## 2012-11-30 DIAGNOSIS — Z0000 Encounter for general adult medical examination without abnormal findings: Secondary | ICD-10-CM

## 2012-11-30 DIAGNOSIS — E785 Hyperlipidemia, unspecified: Secondary | ICD-10-CM | POA: Insufficient documentation

## 2012-11-30 LAB — CBC WITH DIFFERENTIAL/PLATELET
Basophils Relative: 0.5 % (ref 0.0–3.0)
Eosinophils Absolute: 0.2 10*3/uL (ref 0.0–0.7)
Eosinophils Relative: 1.5 % (ref 0.0–5.0)
Hemoglobin: 16.4 g/dL — ABNORMAL HIGH (ref 12.0–15.0)
MCHC: 33.6 g/dL (ref 30.0–36.0)
MCV: 95.7 fl (ref 78.0–100.0)
Monocytes Absolute: 1 10*3/uL (ref 0.1–1.0)
Neutro Abs: 5.4 10*3/uL (ref 1.4–7.7)
RBC: 5.11 Mil/uL (ref 3.87–5.11)

## 2012-11-30 LAB — BASIC METABOLIC PANEL
CO2: 26 mEq/L (ref 19–32)
Chloride: 104 mEq/L (ref 96–112)
Potassium: 3.7 mEq/L (ref 3.5–5.1)
Sodium: 140 mEq/L (ref 135–145)

## 2012-11-30 LAB — HEPATIC FUNCTION PANEL
ALT: 28 U/L (ref 0–35)
AST: 26 U/L (ref 0–37)
Bilirubin, Direct: 0 mg/dL (ref 0.0–0.3)
Total Protein: 7.5 g/dL (ref 6.0–8.3)

## 2012-11-30 LAB — LIPID PANEL: Total CHOL/HDL Ratio: 5

## 2012-11-30 NOTE — Progress Notes (Signed)
Subjective:    Patient ID: Martha Meyer, female    DOB: [birth year], 62 y.o.   MRN: [id number]  HPI Here for physical Concerned about her weight gain of 4#  BP is better Only occasional vague headaches No other symptoms  wacked left 3rd finger with a knife 2 days ago Pain is subsiding Small cut  Has lump on neck she wants checked Dry skin on left elbow---?eczema  Current Outpatient Prescriptions on File Prior to Visit  Medication Sig Dispense Refill  . allopurinol (ZYLOPRIM) 300 MG tablet Take 1 tablet (300 mg total) by mouth daily.  90 tablet  3  . colchicine 0.6 MG tablet Take 0.6 mg by mouth daily.      Marland Kitchen ibuprofen (ADVIL,MOTRIN) 200 MG tablet 2-3 tabs by mouth three times a day with food as needed for gout pain       . losartan (COZAAR) 100 MG tablet Take 1 tablet (100 mg total) by mouth daily.  30 tablet  11   No current facility-administered medications on file prior to visit.    Allergies  Allergen Reactions  . Bupropion Hcl     REACTION: more talkative/ agrressive  . Oxycodone-Acetaminophen     REACTION: n/v  . Paroxetine     REACTION: More talkative/agreesive    Past Medical History  Diagnosis Date  . Depression   . Diverticulosis of colon   . GERD (gastroesophageal reflux disease)   . Hypertension   . Osteoporosis   . Kidney stones   . Osteoarthritis   . Obstructive sleep apnea   . Migraines   . Gout     Past Surgical History  Procedure Laterality Date  . Stone extraction with basket  6/05    Storoff  . Tubal ligation  1990's  . Cesarean section      x 1  . Vaginal delivery      x 1  . Esophagogastroduodenoscopy  3/07    stricture dilated  . Total hip arthroplasty  12/10    R, Dr Despina Hick    Family History  Problem Relation Age of Onset  . Hepatitis Father     Hep C  . Hypertension Father   . Heart attack Mother   . Diabetes Mother   . Coronary artery disease Paternal Grandfather   . Heart failure Paternal Grandfather   . Coronary  artery disease Mother   . Heart failure Mother   . Hypertension Mother     ?  . Diabetes Maternal Grandmother   . Breast cancer Paternal Grandmother   . Colon cancer Neg Hx     History   Social History  . Marital Status: Married    Spouse Name: N/A    Number of Children: 2  . Years of Education: N/A   Occupational History  . 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 Smoking status: Never Smoker   . Smokeless tobacco: Never Used  . Alcohol Use: Yes     Comment: occ  . Drug Use: Not on file  . Sexually Active: Not on file   Other Topics Concern  . Not on file   Social History Narrative  . No narrative on file   Review of Systems  Constitutional: Positive for unexpected weight change. Negative for fatigue.       Wears seat belt  HENT: Positive for hearing loss and tinnitus. Negative for congestion and rhinorrhea.  Has hearing aides Regular with dentist  Eyes: Positive for visual disturbance.       Some blurry vision in AM and evening No diplopia  Respiratory: Negative for cough, chest tightness and shortness of breath.   Cardiovascular: Negative for chest pain, palpitations and leg swelling.  Gastrointestinal: Negative for nausea, vomiting, abdominal pain, constipation and blood in stool.       Occasional heartburn if overeats-- ranitidine prn   Endocrine: Negative for cold intolerance and heat intolerance.  Genitourinary: Positive for dyspareunia. Negative for dysuria, hematuria and difficulty urinating.       Not really much sex in marriage Discussed considering estrogen cream if K-Y is not enough  Musculoskeletal: Positive for back pain and arthralgias. Negative for joint swelling.       Back pain especially with leaning down Joined gym but not going  Some neck pain--from lump?  Skin: Positive for rash.       Has some spots under bra she wants checked  Allergic/Immunologic: Negative for  environmental allergies and immunocompromised state.  Neurological: Positive for headaches. Negative for dizziness, syncope, weakness, light-headedness and numbness.  Hematological: Negative for adenopathy. Does not bruise/bleed easily.  Psychiatric/Behavioral: Positive for sleep disturbance. Negative for dysphoric mood. The patient is not nervous/anxious.        Still with sleep problems New CPAP machine and mask-- autotitrates       Objective:   Physical Exam  Constitutional: She is oriented to person, place, and time. She appears well-developed and well-nourished. No distress.  HENT:  Head: Normocephalic and atraumatic.  Right Ear: External ear normal.  Left Ear: External ear normal.  Mouth/Throat: Oropharynx is clear and moist. No oropharyngeal exudate.  Eyes: Conjunctivae and EOM are normal. Pupils are equal, round, and reactive to light.  Neck: Normal range of motion. Neck supple. No thyromegaly present.  Cardiovascular: Normal rate, normal heart sounds and intact distal pulses.  Exam reveals no gallop.   No murmur heard. Pulmonary/Chest: Effort normal and breath sounds normal. No respiratory distress. She has no wheezes. She has no rales.  Abdominal: There is no tenderness.  Genitourinary:  Moderate cystic changes in both breasts. No worrisome lesions seb keratoses under right breast  Musculoskeletal: She exhibits no edema and no tenderness.  Lymphadenopathy:    She has no cervical adenopathy.    She has no axillary adenopathy.  Neurological: She is alert and oriented to person, place, and time.  Skin: No erythema.  Small cut on left 3rd finger--not inflamed Slight eczematous patch on left elbow Small ?cyst on left posterior neck  Psychiatric: She has a normal mood and affect. Her behavior is normal.          Assessment & Plan:

## 2012-11-30 NOTE — Assessment & Plan Note (Signed)
BP Readings from Last 3 Encounters:  11/30/12 128/82  05/09/12 150/90  03/28/12 148/90   Good control now No changes needed

## 2012-11-30 NOTE — Assessment & Plan Note (Addendum)
Rx given for zostavax Discussed mammo---okay every 2 years Really needs to work on fitness DASH diet info given

## 2012-11-30 NOTE — Patient Instructions (Addendum)
Please get back to the gym and start regular exercise  DASH Diet The DASH diet stands for "Dietary Approaches to Stop Hypertension." It is a healthy eating plan that has been shown to reduce high blood pressure (hypertension) in as little as 14 days, while also possibly providing other significant health benefits. These other health benefits include reducing the risk of breast cancer after menopause and reducing the risk of type 2 diabetes, heart disease, colon cancer, and stroke. Health benefits also include weight loss and slowing kidney failure in patients with chronic kidney disease.  DIET GUIDELINES  Limit salt (sodium). Your diet should contain less than 1500 mg of sodium daily.  Limit refined or processed carbohydrates. Your diet should include mostly whole grains. Desserts and added sugars should be used sparingly.  Include small amounts of heart-healthy fats. These types of fats include nuts, oils, and tub margarine. Limit saturated and trans fats. These fats have been shown to be harmful in the body. CHOOSING FOODS  The following food groups are based on a 2000 calorie diet. See your Registered Dietitian for individual calorie needs. Grains and Grain Products (6 to 8 servings daily)  Eat More Often: Whole-wheat bread, brown rice, whole-grain or wheat pasta, quinoa, popcorn without added fat or salt (air popped).  Eat Less Often: White bread, white pasta, white rice, cornbread. Vegetables (4 to 5 servings daily)  Eat More Often: Fresh, frozen, and canned vegetables. Vegetables may be raw, steamed, roasted, or grilled with a minimal amount of fat.  Eat Less Often/Avoid: Creamed or fried vegetables. Vegetables in a cheese sauce. Fruit (4 to 5 servings daily)  Eat More Often: All fresh, canned (in natural juice), or frozen fruits. Dried fruits without added sugar. One hundred percent fruit juice ( cup [237 mL] daily).  Eat Less Often: Dried fruits with added sugar. Canned fruit in  light or heavy syrup. [employer], Fish, and Poultry (2 servings or less daily. One serving is 3 to 4 oz [85-114 g]).  Eat More Often: Ninety percent or leaner ground beef, tenderloin, sirloin. Round cuts of beef, chicken breast, [place] breast. All fish. Grill, bake, or broil your meat. Nothing should be fried.  Eat Less Often/Avoid: Fatty cuts of meat, [place], or chicken leg, thigh, or wing. Fried cuts of meat or fish. Dairy (2 to 3 servings)  Eat More Often: Low-fat or fat-free milk, low-fat plain or light yogurt, reduced-fat or part-skim cheese.  Eat Less Often/Avoid: Milk (whole, 2%).Whole milk yogurt. Full-fat cheeses. Nuts, Seeds, and Legumes (4 to 5 servings per week)  Eat More Often: All without added salt.  Eat Less Often/Avoid: Salted nuts and seeds, canned beans with added salt. Fats and Sweets (limited)  Eat More Often: Vegetable oils, tub margarines without trans fats, sugar-free gelatin. Mayonnaise and salad dressings.  Eat Less Often/Avoid: Coconut oils, palm oils, butter, stick margarine, cream, half and half, cookies, candy, pie. FOR MORE INFORMATION The Dash Diet Eating Plan: www.dashdiet.org Document Released: 05/07/2011 Document Revised: 08/10/2011 Document Reviewed: 05/07/2011 [employer] Patient Information 2014 [place], [place].

## 2012-11-30 NOTE — Assessment & Plan Note (Signed)
Mild She prefers no meds for primary prevention

## 2012-12-05 ENCOUNTER — Encounter: Payer: Self-pay | Admitting: Internal Medicine

## 2012-12-05 ENCOUNTER — Telehealth: Payer: Self-pay

## 2012-12-05 NOTE — Telephone Encounter (Signed)
Pt cannot get into mychart and request activation code. I put in info for activation code but noted not generated. Adrienne, office mgr said for pt to call [phone]. Pt voiced understanding.

## 2012-12-13 ENCOUNTER — Telehealth: Payer: Self-pay

## 2012-12-13 NOTE — Telephone Encounter (Signed)
Pt left v/m that the insurance co information is where claims are being sent thiniks there is another ins. Co that covers pt; pt wants to update insurance information.Advised pt to call [phone].

## 2013-03-08 ENCOUNTER — Ambulatory Visit (INDEPENDENT_AMBULATORY_CARE_PROVIDER_SITE_OTHER): Payer: Managed Care, Other (non HMO) | Admitting: Pulmonary Disease

## 2013-03-08 ENCOUNTER — Encounter: Payer: Self-pay | Admitting: Pulmonary Disease

## 2013-03-08 VITALS — BP 122/84 | HR 86 | Temp 97.9°F | Ht 60.5 in | Wt 201.0 lb

## 2013-03-08 DIAGNOSIS — G4733 Obstructive sleep apnea (adult) (pediatric): Secondary | ICD-10-CM

## 2013-03-08 NOTE — Progress Notes (Signed)
  Subjective:    Patient ID: Martha Meyer, female    DOB: 1951/02/26, 62 y.o.   MRN: [id number]  HPI The patient comes in today for followup of her obstructive sleep apnea.  She is wearing CPAP compliantly, and she thinks that she is currently on the automatic setting.  She has been having trouble with mask leaks, but tells me that it is too large she needs a smaller size.  She has no issues with pressure tolerance, and feels that she sleeps well with the device other than air leaking into her eyes from her ill fitting mask.  The patient's weight is up 6 pounds since last visit.   Review of Systems  Constitutional: Negative for fever and unexpected weight change.  HENT: Negative for congestion, dental problem, ear pain, nosebleeds, postnasal drip, rhinorrhea, sinus pressure, sneezing, sore throat and trouble swallowing.   Eyes: Negative for redness and itching.  Respiratory: Negative for cough, chest tightness, shortness of breath and wheezing.   Cardiovascular: Negative for palpitations and leg swelling.  Gastrointestinal: Negative for nausea and vomiting.  Genitourinary: Negative for dysuria.  Musculoskeletal: Negative for joint swelling.  Skin: Negative for rash.  Neurological: Negative for headaches.  Hematological: Does not bruise/bleed easily.  Psychiatric/Behavioral: Negative for dysphoric mood. The patient is not nervous/anxious.        Objective:   Physical Exam Obese female in nad Nose without purulence or d/c noted. No skin breakdown or pressure necrosis from the cpap mask. Neck without LN or TMG LE with mild edema, no cyanosis Alert and oriented, moves all 4.  Does not appear to be sleepy.        Assessment & Plan:

## 2013-03-08 NOTE — Patient Instructions (Signed)
Will need to get a download off your machine to make sure everything is going ok. You need to get a new mask that fits better. Work on weight loss followup with me in one year if doing well.

## 2013-03-08 NOTE — Assessment & Plan Note (Signed)
The patient overall is doing fairly well with CPAP, but we need to give a download so that we can check on her progress.  She also needs to get a new mask that is better fitting.  I have also encouraged her to work aggressively on weight loss.

## 2013-03-10 ENCOUNTER — Ambulatory Visit: Payer: Self-pay | Admitting: Internal Medicine

## 2013-03-10 ENCOUNTER — Encounter: Payer: Self-pay | Admitting: Internal Medicine

## 2013-04-20 ENCOUNTER — Telehealth: Payer: Self-pay | Admitting: *Deleted

## 2013-04-20 NOTE — Telephone Encounter (Signed)
My mistake, results were sitting on the corner of my desk. They are now in your [employer] folder for review. 

## 2013-04-20 NOTE — Telephone Encounter (Signed)
D/L requested from [employer] has been received--in green folder to review.

## 2013-04-20 NOTE — Telephone Encounter (Signed)
There is no data in my green folder

## 2013-04-21 ENCOUNTER — Other Ambulatory Visit: Payer: Self-pay | Admitting: Pulmonary Disease

## 2013-04-21 DIAGNOSIS — G4733 Obstructive sleep apnea (adult) (pediatric): Secondary | ICD-10-CM

## 2013-04-21 NOTE — Telephone Encounter (Signed)
Let pt know that her download shows that her sleep apnea is well controlled as long as her mask is not having a large leak.  Need to work on mask fit with dme, but if still having issues, will setup a fitting session at sleep center during the day to work on this.  Will leave machine on the auto setting if that is ok with her.

## 2013-04-21 NOTE — Telephone Encounter (Signed)
Results have been explained to patient, pt expressed understanding. Pt states that her sleep "feels" controlled with the AUTO setting, pt wishes to leave machine set on AUTO.  Pt states that she would like to have the mask fitting set up-- still experiencing dry eyes.

## 2013-04-21 NOTE — Telephone Encounter (Signed)
Order sent for mask fitting.

## 2013-05-02 ENCOUNTER — Ambulatory Visit (HOSPITAL_BASED_OUTPATIENT_CLINIC_OR_DEPARTMENT_OTHER): Payer: Managed Care, Other (non HMO) | Attending: Pulmonary Disease | Admitting: Radiology

## 2013-05-02 DIAGNOSIS — G4733 Obstructive sleep apnea (adult) (pediatric): Secondary | ICD-10-CM

## 2013-11-17 ENCOUNTER — Other Ambulatory Visit: Payer: Self-pay | Admitting: Internal Medicine

## 2013-12-04 ENCOUNTER — Encounter: Payer: Self-pay | Admitting: Internal Medicine

## 2013-12-04 ENCOUNTER — Other Ambulatory Visit (HOSPITAL_COMMUNITY)
Admission: RE | Admit: 2013-12-04 | Discharge: 2013-12-04 | Disposition: A | Discharge status: Home / Self Care | Payer: Managed Care, Other (non HMO) | Source: Ambulatory Visit | Attending: Internal Medicine | Admitting: Internal Medicine

## 2013-12-04 ENCOUNTER — Ambulatory Visit (INDEPENDENT_AMBULATORY_CARE_PROVIDER_SITE_OTHER): Payer: Managed Care, Other (non HMO) | Admitting: Internal Medicine

## 2013-12-04 VITALS — BP 110/80 | HR 80 | Temp 98.6°F | Ht 61.0 in | Wt 197.0 lb

## 2013-12-04 DIAGNOSIS — Z1151 Encounter for screening for human papillomavirus (HPV): Secondary | ICD-10-CM | POA: Insufficient documentation

## 2013-12-04 DIAGNOSIS — E785 Hyperlipidemia, unspecified: Secondary | ICD-10-CM

## 2013-12-04 DIAGNOSIS — I10 Essential (primary) hypertension: Secondary | ICD-10-CM

## 2013-12-04 DIAGNOSIS — N952 Postmenopausal atrophic vaginitis: Secondary | ICD-10-CM | POA: Insufficient documentation

## 2013-12-04 DIAGNOSIS — Z0000 Encounter for general adult medical examination without abnormal findings: Secondary | ICD-10-CM

## 2013-12-04 DIAGNOSIS — M109 Gout, unspecified: Secondary | ICD-10-CM

## 2013-12-04 DIAGNOSIS — Z01419 Encounter for gynecological examination (general) (routine) without abnormal findings: Secondary | ICD-10-CM | POA: Insufficient documentation

## 2013-12-04 MED ORDER — ESTRADIOL 0.1 MG/GM VA CREA: 1.0000 | TOPICAL_CREAM | Freq: Every day | VAGINAL | Status: DC

## 2013-12-04 NOTE — Assessment & Plan Note (Signed)
Controlled with allopurinol and rare prn colchicine

## 2013-12-04 NOTE — Progress Notes (Signed)
Pre visit review using our clinic review tool, if applicable. No additional management support is needed unless otherwise documented below in the visit note. 

## 2013-12-04 NOTE — Assessment & Plan Note (Signed)
Highly symptomatic  Will try estrogen cream

## 2013-12-04 NOTE — Progress Notes (Signed)
Subjective:    Patient ID: Martha Meyer, female    DOB: 04/20/1951, 63 y.o.   MRN: [id number]  HPI Here for physical  Did have trouble with generalized itching a couple of weeks ago Finally better-- never figured out what caused it  Doing better with CPAP--new mask Still has some dry eye problems---seeing eye doctor (blepharitis)  Has had left ankle swelling yesterday Gout? Takes the allopurinol daily---only colchicine prn now  No problems with the BP med Doesn't check her BP Avoids salt Not much exercise Weight is down a few pounds since last year  Current Outpatient Prescriptions on File Prior to Visit  Medication Sig Dispense Refill  . allopurinol (ZYLOPRIM) 300 MG tablet Take 1 tablet (300 mg  total) by mouth daily.  90 tablet  0  . colchicine 0.6 MG tablet Take 0.6 mg by mouth 2 (two) times daily as needed. For gout flares      . ibuprofen (ADVIL,MOTRIN) 200 MG tablet 2-3 tabs by mouth three times a day with food as needed for gout pain       . losartan (COZAAR) 100 MG tablet Take 1 tablet (100 mg  total) by mouth daily.  90 tablet  0   No current facility-administered medications on file prior to visit.    Allergies  Allergen Reactions  . Bupropion Hcl     REACTION: more talkative/ agrressive  . Oxycodone-Acetaminophen     REACTION: n/v  . Paroxetine     REACTION: More talkative/agreesive    Past Medical History  Diagnosis Date  . Depression   . Diverticulosis of colon   . GERD (gastroesophageal reflux disease)   . Hypertension   . Osteoporosis   . Kidney stones   . Osteoarthritis   . Obstructive sleep apnea   . Migraines   . Gout   . Hyperlipidemia     Past Surgical History  Procedure Laterality Date  . Stone extraction with basket  6/05    St[facility]off  . Tubal ligation  1990's  . Cesarean section      x 1  . Vaginal delivery      x 1  . Esophagogastroduodenoscopy  3/07    stricture dilated  . Total hip arthroplasty  12/10    R, Dr Maureen Ralphs      Family History  Problem Relation Age of Onset  . Hepatitis Father     Hep C  . Hypertension Father   . Heart attack Mother   . Diabetes Mother   . Coronary artery disease Paternal Grandfather   . Heart failure Paternal Grandfather   . Coronary artery disease Mother   . Heart failure Mother   . Hypertension Mother     ?  . Diabetes Maternal Grandmother   . Breast cancer Paternal Grandmother   . Colon cancer Neg Hx     History   Social History  . Marital Status: Married    Spouse Name: N/A    Number of Children: 2  . Years of Education: N/A   Occupational History  . 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 Smoking status: Never Smoker   . Smokeless tobacco: Never Used  . Alcohol Use: Yes     Comment: occ  . Drug Use: Not on file  . Sexual Activity: Not on file   Other Topics Concern  . Not on file   Social  History Narrative  . No narrative on file   Review of Systems  Constitutional: Negative for fatigue and unexpected weight change.       No diplopia or unilateral vision loss Some left eye problems with floaters  HENT: Positive for hearing loss and tinnitus.        Has hearing aides Regular with dentist  Respiratory: Negative for cough, chest tightness and shortness of breath.   Cardiovascular: Positive for chest pain and leg swelling. Negative for palpitations.       Had brief pain by left clavicle--?muscle pull Mild leg swelling  Gastrointestinal: Negative for nausea, vomiting, abdominal pain, constipation and blood in stool.       Rare heartburn--?related to tea  Endocrine: Negative for cold intolerance and heat intolerance.  Genitourinary: Positive for dyspareunia. Negative for dysuria, hematuria and difficulty urinating.       Still has pain with sex--discussed estrogen cream  Musculoskeletal: Positive for arthralgias and back pain.       Occ low back or hand pain The gout has been  controlled  Skin: Negative for rash.       Recent itching but no clear rash--better now  Allergic/Immunologic: Positive for environmental allergies. Negative for immunocompromised state.       Occ runny eyes or nose  Neurological: Negative for dizziness, syncope, weakness, light-headedness, numbness and headaches.  Hematological: Negative for adenopathy. Does not bruise/bleed easily.  Psychiatric/Behavioral: Negative for sleep disturbance and dysphoric mood. The patient is not nervous/anxious.        Rare bad nights with sleep       Objective:   Physical Exam  Constitutional: She is oriented to person, place, and time. She appears well-developed and well-nourished. No distress.  HENT:  Head: Normocephalic and atraumatic.  Right Ear: External ear normal.  Left Ear: External ear normal.  Mouth/Throat: Oropharynx is clear and moist. No oropharyngeal exudate.  Eyes: Conjunctivae and EOM are normal. Pupils are equal, round, and reactive to light.  Neck: Normal range of motion. Neck supple. No thyromegaly present.  Cardiovascular: Normal rate, regular rhythm, normal heart sounds and intact distal pulses.  Exam reveals no gallop.   No murmur heard. Pulmonary/Chest: Effort normal and breath sounds normal. No respiratory distress. She has no wheezes. She has no rales.  Abdominal: Soft. There is no tenderness.  Genitourinary:  Pendulous breasts with some periareolar cystic changes  Significant vaginal stenosis---hard to open speculum. Limited view of cervix--but looks okay 1 finger bimanual negative Pap done  Musculoskeletal: She exhibits no edema and no tenderness.  Lymphadenopathy:    She has no cervical adenopathy.  Neurological: She is alert and oriented to person, place, and time.  Skin: No rash noted. No erythema.  Psychiatric: She has a normal mood and affect. Her behavior is normal.          Assessment & Plan:

## 2013-12-04 NOTE — Addendum Note (Signed)
Addended by: Despina Hidden on: 12/04/2013 04:15 PM   Modules accepted: Orders

## 2013-12-04 NOTE — Addendum Note (Signed)
Addended by: Ellamae Sia on: 12/04/2013 04:22 PM   Modules accepted: Orders

## 2013-12-04 NOTE — Patient Instructions (Signed)
I recommend a mammogram every 2 years (due 10/16).  DASH Eating Plan DASH stands for "Dietary Approaches to Stop Hypertension." The DASH eating plan is a healthy eating plan that has been shown to reduce high blood pressure (hypertension). Additional health benefits may include reducing the risk of type 2 diabetes mellitus, heart disease, and stroke. The DASH eating plan may also help with weight loss. WHAT DO I NEED TO KNOW ABOUT THE DASH EATING PLAN? For the DASH eating plan, you will follow these general guidelines:  Choose foods with a percent daily value for sodium of less than 5% (as listed on the food label).  Use salt-free seasonings or herbs instead of table salt or sea salt.  Check with your health care provider or pharmacist before using salt substitutes.  Eat lower-sodium products, often labeled as "lower sodium" or "no salt added."  Eat fresh foods.  Eat more vegetables, fruits, and low-fat dairy products.  Choose whole grains. Look for the word "whole" as the first word in the ingredient list.  Choose fish and skinless chicken or [place] more often than red meat. Limit fish, poultry, and meat to 6 oz (170 g) each day.  Limit sweets, desserts, sugars, and sugary drinks.  Choose heart-healthy fats.  Limit cheese to 1 oz (28 g) per day.  Eat more home-cooked food and less restaurant, buffet, and fast food.  Limit fried foods.  Cook foods using methods other than frying.  Limit canned vegetables. If you do use them, rinse them well to decrease the sodium.  When eating at a restaurant, ask that your food be prepared with less salt, or no salt if possible. WHAT FOODS CAN I EAT? Seek help from a dietitian for individual calorie needs. Grains Whole grain or whole wheat bread. Brown rice. Whole grain or whole wheat pasta. Quinoa, bulgur, and whole grain cereals. Low-sodium cereals. Corn or whole wheat flour tortillas. Whole grain cornbread. Whole grain crackers.  Low-sodium crackers. Vegetables Fresh or frozen vegetables (raw, steamed, roasted, or grilled). Low-sodium or reduced-sodium tomato and vegetable juices. Low-sodium or reduced-sodium tomato sauce and paste. Low-sodium or reduced-sodium canned vegetables.  Fruits All fresh, canned (in natural juice), or frozen fruits. Meat and Other Protein Products Ground beef (85% or leaner), grass-fed beef, or beef trimmed of fat. Skinless chicken or [place]. Ground chicken or [place]. Pork trimmed of fat. All fish and seafood. Eggs. Dried beans, peas, or lentils. Unsalted nuts and seeds. Unsalted canned beans. Dairy Low-fat dairy products, such as skim or 1% milk, 2% or reduced-fat cheeses, low-fat ricotta or cottage cheese, or plain low-fat yogurt. Low-sodium or reduced-sodium cheeses. Fats and Oils Tub margarines without trans fats. Light or reduced-fat mayonnaise and salad dressings (reduced sodium). Avocado. Safflower, olive, or canola oils. Natural peanut or almond butter. Other Unsalted popcorn and pretzels. The items listed above may not be a complete list of recommended foods or beverages. Contact your dietitian for more options. WHAT FOODS ARE NOT RECOMMENDED? Grains White bread. White pasta. White rice. Refined cornbread. Bagels and croissants. Crackers that contain trans fat. Vegetables Creamed or fried vegetables. Vegetables in a cheese sauce. Regular canned vegetables. Regular canned tomato sauce and paste. Regular tomato and vegetable juices. Fruits Dried fruits. Canned fruit in light or heavy syrup. Fruit juice. Meat and Other Protein Products Fatty cuts of meat. Ribs, chicken wings, bacon, sausage, bologna, salami, chitterlings, fatback, hot dogs, bratwurst, and packaged luncheon meats. Salted nuts and seeds. Canned beans with salt. Dairy Whole or 2% milk,  cream, half-and-half, and cream cheese. Whole-fat or sweetened yogurt. Full-fat cheeses or blue cheese. Nondairy creamers and whipped  toppings. Processed cheese, cheese spreads, or cheese curds. Condiments Onion and garlic salt, seasoned salt, table salt, and sea salt. Canned and packaged gravies. Worcestershire sauce. Tartar sauce. Barbecue sauce. Teriyaki sauce. Soy sauce, including reduced sodium. Steak sauce. Fish sauce. Oyster sauce. Cocktail sauce. Horseradish. Ketchup and mustard. Meat flavorings and tenderizers. Bouillon cubes. Hot sauce. Tabasco sauce. Marinades. Taco seasonings. Relishes. Fats and Oils Butter, stick margarine, lard, shortening, ghee, and bacon fat. Coconut, palm kernel, or palm oils. Regular salad dressings. Other Pickles and olives. Salted popcorn and pretzels. The items listed above may not be a complete list of foods and beverages to avoid. Contact your dietitian for more information. WHERE CAN I FIND MORE INFORMATION? National Heart, Lung, and Blood Institute: travelstabloid.com Document Released: 05/07/2011 Document Revised: 05/23/2013 Document Reviewed: 03/22/2013 [facility] Patient Information 2015 [place], [place]. This information is not intended to replace advice given to you by your health care provider. Make sure you discuss any questions you have with your health care provider.

## 2013-12-04 NOTE — Assessment & Plan Note (Signed)
Discussed  She does not want statin for primary prevention

## 2013-12-04 NOTE — Assessment & Plan Note (Signed)
BP Readings from Last 3 Encounters:  12/04/13 110/80  03/08/13 122/84  11/30/12 128/82   Good control No changes needed

## 2013-12-04 NOTE — Assessment & Plan Note (Signed)
Really needs to work on fitness mammo due 2016 Pap done today Yearly flu shots

## 2013-12-05 ENCOUNTER — Telehealth: Payer: Self-pay | Admitting: Internal Medicine

## 2013-12-05 LAB — CBC WITH DIFFERENTIAL/PLATELET
BASOS ABS: 0 10*3/uL (ref 0.0–0.2)
Basos: 0 %
EOS: 3 %
Eosinophils Absolute: 0.2 10*3/uL (ref 0.0–0.4)
HEMATOCRIT: 45.5 % (ref 34.0–46.6)
Hemoglobin: 15.6 g/dL (ref 11.1–15.9)
IMMATURE GRANS (ABS): 0 10*3/uL (ref 0.0–0.1)
IMMATURE GRANULOCYTES: 0 %
LYMPHS ABS: 2.7 10*3/uL (ref 0.7–3.1)
LYMPHS: 33 %
MCH: 31.8 pg (ref 26.6–33.0)
MCHC: 34.3 g/dL (ref 31.5–35.7)
MCV: 93 fL (ref 79–97)
MONOCYTES: 7 %
Monocytes Absolute: 0.6 10*3/uL (ref 0.1–0.9)
NEUTROS PCT: 57 %
Neutrophils Absolute: 4.5 10*3/uL (ref 1.4–7.0)
RBC: 4.9 x10E6/uL (ref 3.77–5.28)
RDW: 13.4 % (ref 12.3–15.4)
WBC: 8 10*3/uL (ref 3.4–10.8)

## 2013-12-05 LAB — COMPREHENSIVE METABOLIC PANEL
A/G RATIO: 1.8 (ref 1.1–2.5)
ALK PHOS: 69 IU/L (ref 39–117)
ALT: 21 IU/L (ref 0–32)
AST: 21 IU/L (ref 0–40)
Albumin: 4.2 g/dL (ref 3.6–4.8)
BUN / CREAT RATIO: 10 — AB (ref 11–26)
BUN: 8 mg/dL (ref 8–27)
CO2: 22 mmol/L (ref 18–29)
Calcium: 9.1 mg/dL (ref 8.7–10.3)
Chloride: 103 mmol/L (ref 97–108)
Creatinine, Ser: 0.78 mg/dL (ref 0.57–1.00)
GFR, EST AFRICAN AMERICAN: 95 mL/min/{1.73_m2} (ref >59)
GFR, EST NON AFRICAN AMERICAN: 82 mL/min/{1.73_m2} (ref >59)
GLUCOSE: 88 mg/dL (ref 65–99)
Globulin, Total: 2.4 g/dL (ref 1.5–4.5)
Potassium: 4.5 mmol/L (ref 3.5–5.2)
SODIUM: 147 mmol/L — AB (ref 134–144)
TOTAL PROTEIN: 6.6 g/dL (ref 6.0–8.5)
Total Bilirubin: 0.6 mg/dL (ref 0.0–1.2)

## 2013-12-05 LAB — LIPID PANEL
Chol/HDL Ratio: 4.5 ratio units — ABNORMAL HIGH (ref 0.0–4.4)
Cholesterol, Total: 194 mg/dL (ref 100–199)
HDL: 43 mg/dL (ref >39)
LDL Calculated: 114 mg/dL — ABNORMAL HIGH (ref 0–99)
TRIGLYCERIDES: 186 mg/dL — AB (ref 0–149)
VLDL CHOLESTEROL CAL: 37 mg/dL (ref 5–40)

## 2013-12-05 LAB — T4, FREE: Free T4: 0.97 ng/dL (ref 0.82–1.77)

## 2013-12-05 NOTE — Telephone Encounter (Signed)
Relevant patient education assigned to patient using Emmi. ° °

## 2013-12-06 LAB — CYTOLOGY - PAP

## 2014-01-05 ENCOUNTER — Other Ambulatory Visit: Payer: Self-pay | Admitting: Internal Medicine

## 2014-03-08 ENCOUNTER — Ambulatory Visit (INDEPENDENT_AMBULATORY_CARE_PROVIDER_SITE_OTHER): Payer: Managed Care, Other (non HMO) | Admitting: Pulmonary Disease

## 2014-03-08 ENCOUNTER — Encounter: Payer: Self-pay | Admitting: Pulmonary Disease

## 2014-03-08 VITALS — BP 118/66 | HR 81 | Temp 98.4°F | Ht 61.5 in | Wt 203.2 lb

## 2014-03-08 DIAGNOSIS — G4733 Obstructive sleep apnea (adult) (pediatric): Secondary | ICD-10-CM

## 2014-03-08 NOTE — Patient Instructions (Signed)
Continue on cpap, and keep up with the mask changes and supplies. Work on weight loss followup with me again in one year.

## 2014-03-08 NOTE — Assessment & Plan Note (Signed)
The patient appears to be doing well on her current CPAP setup, and I've encouraged her to keep up with her mask changes and supplies and to work aggressively on weight loss. I will see her back in one year.

## 2014-03-08 NOTE — Progress Notes (Signed)
   Subjective:    Patient ID: Martha Meyer, female    DOB: [birth year], 63 y.o.   MRN: [id number]  HPI The patient comes in today for followup of her obstructive sleep apnea. She is wearing CPAP compliantly, and is having no issues with her mask fit or pressure. She feels that she sleeps we simply well, and is satisfied with her daytime alertness. He should be her weight is unchanged from the previous visit.   Review of Systems  Constitutional: Negative for fever and unexpected weight change.  HENT: Negative for congestion, dental problem, ear pain, nosebleeds, postnasal drip, rhinorrhea, sinus pressure, sneezing, sore throat and trouble swallowing.   Eyes: Negative for redness and itching.  Respiratory: Negative for cough, chest tightness, shortness of breath and wheezing.   Cardiovascular: Negative for palpitations and leg swelling.  Gastrointestinal: Negative for nausea and vomiting.  Genitourinary: Negative for dysuria.  Musculoskeletal: Negative for joint swelling.  Skin: Negative for rash.  Neurological: Negative for headaches.  Hematological: Does not bruise/bleed easily.  Psychiatric/Behavioral: Negative for dysphoric mood. The patient is not nervous/anxious.        Objective:   Physical Exam Obese female in no acute distress Nose without purulence or discharge noted Neck without lymphadenopathy or thyromegaly No skin breakdown or pressure necrosis from the CPAP mask Lower extremities with minimal edema, no cyanosis Alert and oriented, moves all 4 extremities.       Assessment & Plan:

## 2014-04-11 ENCOUNTER — Telehealth: Payer: Self-pay | Admitting: Pulmonary Disease

## 2014-04-11 NOTE — Telephone Encounter (Signed)
Order was sent 03-2014 for CPAP supplies-pt states she has not gotten any supplies and needs our office to contact the company. Will forward to PCC's to assist in this matter. Pt will await a call back to know when this has been taken care of.

## 2014-04-11 NOTE — Telephone Encounter (Signed)
Order and request given to Dr Gwenette Greet for signature once signed will fax .Verdie Mosher

## 2014-04-16 ENCOUNTER — Telehealth: Payer: Self-pay | Admitting: Pulmonary Disease

## 2014-04-16 NOTE — Telephone Encounter (Signed)
Called and spoke to pt. Pt requesting update on order. Advised pt that once from is signed it will be faxed. Pt verbalized understanding and denied any further questions or concerns at this time.    Alison Stalling, CMA at 04/11/2014 2:37 PM     Status: Signed       Expand All Collapse All   Order and request given to Dr Gwenette Greet for signature once signed will fax .[place], please advise if this has been faxed.

## 2014-04-19 NOTE — Telephone Encounter (Signed)
Mindy do you know has Dr Gwenette Greet signed this this was given to [facility] on 04/11/14 and pt was informed once signed we will fax.

## 2014-04-20 NOTE — Telephone Encounter (Signed)
I called [employer] [facility] [employer] to see iif they have signed order, spoke with Larene Beach who says patient was taken care of yesterday, She has her supplies. Signed order was not given back to me as requested, however pt has her supplies, nothing else needed. Signed must be in scan somewhere, .Verdie Mosher

## 2014-04-20 NOTE — Telephone Encounter (Signed)
I have not received anything. Please advise [place] if you have this? thanks

## 2014-04-20 NOTE — Telephone Encounter (Signed)
It is not in either of my folders.  That means it either never got there, or I have already signed and is in [place] land somewhere.

## 2014-12-07 ENCOUNTER — Encounter: Payer: Self-pay | Admitting: Internal Medicine

## 2014-12-07 ENCOUNTER — Ambulatory Visit (INDEPENDENT_AMBULATORY_CARE_PROVIDER_SITE_OTHER): Payer: Managed Care, Other (non HMO) | Admitting: Internal Medicine

## 2014-12-07 VITALS — BP 120/80 | HR 86 | Temp 97.4°F | Ht 62.0 in | Wt 197.0 lb

## 2014-12-07 DIAGNOSIS — Z0000 Encounter for general adult medical examination without abnormal findings: Secondary | ICD-10-CM

## 2014-12-07 DIAGNOSIS — I10 Essential (primary) hypertension: Secondary | ICD-10-CM

## 2014-12-07 DIAGNOSIS — M1000 Idiopathic gout, unspecified site: Secondary | ICD-10-CM | POA: Diagnosis not present

## 2014-12-07 NOTE — Patient Instructions (Addendum)
Please set up your mammogram by this October. You are due for a colonoscopy in January---set this up when they contact you.  Exercise to Lose Weight Exercise and a healthy diet may help you lose weight. Your doctor may suggest specific exercises. EXERCISE IDEAS AND TIPS  Choose low-cost things you enjoy doing, such as walking, bicycling, or exercising to workout videos.  Take stairs instead of the elevator.  Walk during your lunch break.  Park your car further away from work or school.  Go to a gym or an exercise class.  Start with 5 to 10 minutes of exercise each day. Build up to 30 minutes of exercise 4 to 6 days a week.  Wear shoes with good support and comfortable clothes.  Stretch before and after working out.  Work out until you breathe harder and your heart beats faster.  Drink extra water when you exercise.  Do not do so much that you hurt yourself, feel dizzy, or get very short of breath. Exercises that burn about 150 calories:  Running 1  miles in 15 minutes.  Playing volleyball for 45 to 60 minutes.  Washing and waxing a car for 45 to 60 minutes.  Playing touch football for 45 minutes.  Walking 1  miles in 35 minutes.  Pushing a stroller 1  miles in 30 minutes.  Playing basketball for 30 minutes.  Raking leaves for 30 minutes.  Bicycling 5 miles in 30 minutes.  Walking 2 miles in 30 minutes.  Dancing for 30 minutes.  Shoveling snow for 15 minutes.  Swimming laps for 20 minutes.  Walking up stairs for 15 minutes.  Bicycling 4 miles in 15 minutes.  Gardening for 30 to 45 minutes.  Jumping rope for 15 minutes.  Washing windows or floors for 45 to 60 minutes. Document Released: 06/20/2010 Document Revised: 08/10/2011 Document Reviewed: 06/20/2010 [facility] Patient Information 2015 [place], [place]. This information is not intended to replace advice given to you by your health care provider. Make sure you discuss any questions you have with  your health care provider.

## 2014-12-07 NOTE — Assessment & Plan Note (Signed)
BP Readings from Last 3 Encounters:  12/07/14 120/80  03/08/14 118/66  12/04/13 110/80   Good control

## 2014-12-07 NOTE — Addendum Note (Signed)
Addended by: Daralene Milch C on: 12/07/2014 11:33 AM   Modules accepted: Orders, SmartSet

## 2014-12-07 NOTE — Assessment & Plan Note (Signed)
Quiet on the allopurinol 

## 2014-12-07 NOTE — Progress Notes (Signed)
Pre visit review using our clinic review tool, if applicable. No additional management support is needed unless otherwise documented below in the visit note. 

## 2014-12-07 NOTE — Assessment & Plan Note (Signed)
Generally healthy but needs to work on fitness Pap due in 2 years Colonoscopy due soon mammo in October

## 2014-12-07 NOTE — Progress Notes (Signed)
Subjective:    Patient ID: Martha Meyer, female    DOB: 1950/07/21, 64 y.o.   MRN: [id number]  HPI Here for physical  Has had some recent knee problems Seems better now Didn't seem to be gout--- and this is mostly quiet (unless she splurges and has some shrimp) Not really exercising---stopped walking (other than walking dogs)  No problems with BP med  Sleeps better with CPAP Still has awakening 2-3 times per night Takes occasional nap  Current Outpatient Prescriptions on File Prior to Visit  Medication Sig Dispense Refill  . allopurinol (ZYLOPRIM) 300 MG tablet Take 1 tablet (300mg    total) by mouth daily. 90 tablet 3  . colchicine 0.6 MG tablet Take 0.6 mg by mouth 2 (two) times daily as needed. For gout flares    . estradiol (ESTRACE) 0.1 MG/GM vaginal cream Place 1 Applicatorful vaginally at bedtime. 42.5 g 12  . ibuprofen (ADVIL,MOTRIN) 200 MG tablet 2-3 tabs by mouth three times a day with food as needed for gout pain     . losartan (COZAAR) 100 MG tablet Take 1 tablet by mouth  daily 90 tablet 3   No current facility-administered medications on file prior to visit.    Allergies  Allergen Reactions  . Bupropion Hcl     REACTION: more talkative/ agrressive  . Oxycodone-Acetaminophen     REACTION: n/v  . Paroxetine     REACTION: More talkative/agreesive    Past Medical History  Diagnosis Date  . Depression   . Diverticulosis of colon   . GERD (gastroesophageal reflux disease)   . Hypertension   . Osteoporosis   . Kidney stones   . Osteoarthritis   . Obstructive sleep apnea   . Migraines   . Gout   . Hyperlipidemia     Past Surgical History  Procedure Laterality Date  . Stone extraction with basket  6/05    Storoff  . Tubal ligation  1990's  . Cesarean section      x 1  . Vaginal delivery      x 1  . Esophagogastroduodenoscopy  3/07    stricture dilated  . Total hip arthroplasty  12/10    R, Dr Maureen Ralphs    Family History  Problem Relation Age  of Onset  . Hepatitis Father     Hep C  . Hypertension Father   . Heart attack Mother   . Diabetes Mother   . Coronary artery disease Mother   . Heart failure Mother   . Hypertension Mother     ?  . Coronary artery disease Paternal Grandfather   . Heart failure Paternal Grandfather   . Diabetes Maternal Grandmother   . Breast cancer Paternal Grandmother   . Colon cancer Neg Hx     History   Social History  . Marital Status: Married    Spouse Name: N/A  . Number of Children: 2  . Years of Education: N/A   Occupational History  . 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 Smoking status: Never Smoker   . Smokeless tobacco: Never Used  . Alcohol Use: Yes     Comment: occ  . Drug Use: Not on file  . Sexual Activity: Not on file   Other Topics Concern  . Not on file   Social History Narrative   Review of Systems  Constitutional: Negative for fatigue and unexpected weight  change.       Wears seat belt  HENT: Positive for hearing loss and tinnitus. Negative for dental problem.        Has hearing aides Keeps up with dentist  Eyes: Negative for visual disturbance.       No diplopia or unilateral vision loss Has had some floaters--exam okay  Respiratory: Negative for cough, chest tightness and shortness of breath.        Prolonged cough illness this winter---better now  Cardiovascular: Positive for leg swelling. Negative for chest pain and palpitations.  Gastrointestinal: Negative for nausea, vomiting, abdominal pain, constipation and blood in stool.       Heartburn not an issue lately  Endocrine: Positive for polyuria. Negative for polydipsia.  Genitourinary: Positive for frequency and dyspareunia. Negative for dysuria and hematuria.       Hasn't been regular with estrogen cream--discussed  Skin: Negative for rash.       Itchy at times No suspicious lesions--some dark areas under breasts  Neurological:  Positive for numbness. Negative for dizziness, syncope, weakness, light-headedness and headaches.       Rare numbness in hands  Hematological: Negative for adenopathy. Does not bruise/bleed easily.  Psychiatric/Behavioral: Positive for sleep disturbance. Negative for dysphoric mood. The patient is not nervous/anxious.        Objective:   Physical Exam  Constitutional: She is oriented to person, place, and time. She appears well-developed and well-nourished. No distress.  HENT:  Head: Normocephalic and atraumatic.  Right Ear: External ear normal.  Left Ear: External ear normal.  Mouth/Throat: Oropharynx is clear and moist.  Eyes: Conjunctivae and EOM are normal. Pupils are equal, round, and reactive to light.  Neck: Normal range of motion. Neck supple. No thyromegaly present.  Cardiovascular: Normal rate, regular rhythm, normal heart sounds and intact distal pulses.  Exam reveals no gallop.   No murmur heard. Pulmonary/Chest: Effort normal and breath sounds normal. No respiratory distress. She has no wheezes. She has no rales.  Abdominal: Soft. There is no tenderness.  Musculoskeletal: She exhibits no edema or tenderness.  Lymphadenopathy:    She has no cervical adenopathy.  Neurological: She is alert and oriented to person, place, and time.  Skin: No rash noted. No erythema.  seb keratoses under right breast  Psychiatric: She has a normal mood and affect. Her behavior is normal.          Assessment & Plan:

## 2014-12-08 LAB — CBC WITH DIFFERENTIAL/PLATELET
BASOS ABS: 0 10*3/uL (ref 0.0–0.2)
BASOS: 0 %
EOS (ABSOLUTE): 0.1 10*3/uL (ref 0.0–0.4)
Eos: 2 %
HEMATOCRIT: 47.1 % — AB (ref 34.0–46.6)
HEMOGLOBIN: 15.9 g/dL (ref 11.1–15.9)
IMMATURE GRANS (ABS): 0 10*3/uL (ref 0.0–0.1)
Immature Granulocytes: 0 %
LYMPHS: 33 %
Lymphocytes Absolute: 2.5 10*3/uL (ref 0.7–3.1)
MCH: 31.1 pg (ref 26.6–33.0)
MCHC: 33.8 g/dL (ref 31.5–35.7)
MCV: 92 fL (ref 79–97)
MONOCYTES: 9 %
Monocytes Absolute: 0.7 10*3/uL (ref 0.1–0.9)
NEUTROS ABS: 4.2 10*3/uL (ref 1.4–7.0)
NEUTROS PCT: 56 %
Platelets: 274 10*3/uL (ref 150–379)
RBC: 5.11 x10E6/uL (ref 3.77–5.28)
RDW: 14 % (ref 12.3–15.4)
WBC: 7.5 10*3/uL (ref 3.4–10.8)

## 2014-12-08 LAB — COMPREHENSIVE METABOLIC PANEL
ALK PHOS: 68 IU/L (ref 39–117)
ALT: 20 IU/L (ref 0–32)
AST: 20 IU/L (ref 0–40)
Albumin/Globulin Ratio: 2.1 (ref 1.1–2.5)
Albumin: 4.5 g/dL (ref 3.6–4.8)
BILIRUBIN TOTAL: 0.6 mg/dL (ref 0.0–1.2)
BUN / CREAT RATIO: 16 (ref 11–26)
BUN: 11 mg/dL (ref 8–27)
CALCIUM: 9.3 mg/dL (ref 8.7–10.3)
CO2: 23 mmol/L (ref 18–29)
CREATININE: 0.7 mg/dL (ref 0.57–1.00)
Chloride: 100 mmol/L (ref 97–108)
GFR calc Af Amer: 107 mL/min/{1.73_m2} (ref >59)
GFR calc non Af Amer: 93 mL/min/{1.73_m2} (ref >59)
GLOBULIN, TOTAL: 2.1 g/dL (ref 1.5–4.5)
Glucose: 96 mg/dL (ref 65–99)
Potassium: 4.7 mmol/L (ref 3.5–5.2)
Sodium: 140 mmol/L (ref 134–144)
Total Protein: 6.6 g/dL (ref 6.0–8.5)

## 2014-12-08 LAB — URIC ACID: Uric Acid: 5.2 mg/dL (ref 2.5–7.1)

## 2014-12-08 LAB — LIPID PANEL
Chol/HDL Ratio: 4.2 ratio units (ref 0.0–4.4)
Cholesterol, Total: 209 mg/dL — ABNORMAL HIGH (ref 100–199)
HDL: 50 mg/dL (ref >39)
LDL Calculated: 128 mg/dL — ABNORMAL HIGH (ref 0–99)
TRIGLYCERIDES: 155 mg/dL — AB (ref 0–149)
VLDL Cholesterol Cal: 31 mg/dL (ref 5–40)

## 2014-12-08 LAB — T4, FREE: Free T4: 0.9 ng/dL (ref 0.82–1.77)

## 2015-03-05 ENCOUNTER — Other Ambulatory Visit: Payer: Self-pay | Admitting: Internal Medicine

## 2015-03-11 ENCOUNTER — Ambulatory Visit: Payer: Managed Care, Other (non HMO) | Admitting: Pulmonary Disease

## 2015-04-30 ENCOUNTER — Ambulatory Visit (INDEPENDENT_AMBULATORY_CARE_PROVIDER_SITE_OTHER): Payer: Managed Care, Other (non HMO) | Admitting: Internal Medicine

## 2015-04-30 ENCOUNTER — Encounter: Payer: Self-pay | Admitting: Internal Medicine

## 2015-04-30 VITALS — BP 142/88 | HR 87 | Ht 61.5 in | Wt 206.6 lb

## 2015-04-30 DIAGNOSIS — G4733 Obstructive sleep apnea (adult) (pediatric): Secondary | ICD-10-CM | POA: Diagnosis not present

## 2015-04-30 DIAGNOSIS — I10 Essential (primary) hypertension: Secondary | ICD-10-CM

## 2015-04-30 NOTE — Patient Instructions (Signed)
Order- new DME to continue CPAP auto 5-15, mask of choice, humidifier, supplies, AirView   Dx OSA     Consider trying the otc nasal saline gel if needed for dry nose and nose bleeds

## 2015-04-30 NOTE — Assessment & Plan Note (Signed)
Auto titration is okay. Needs new DME company to replace hers which went out of business. Plan-help establish new DME company, continue CPAP.

## 2015-04-30 NOTE — Assessment & Plan Note (Signed)
Discussed interaction HBP and OSA

## 2015-04-30 NOTE — Progress Notes (Signed)
   Subjective:    Patient ID: Martha Meyer, female    DOB: 01/07/1951, 64 y.o.   MRN: [id number]  HPI 03/08/2014- Dr Gwenette Greet The patient comes in today for followup of her obstructive sleep apnea. She is wearing CPAP compliantly, and is having no issues with her mask fit or pressure. She feels that she sleeps we simply well, and is satisfied with her daytime alertness. He should be her weight is unchanged from the previous visit.  04/30/2015-64 year old female never smoker followed for OSA, complicated by HBP NPSG [id number], AHI 13/ hr CPAP auto/ [facility]- need new DME FOLLOWS FOR: Former [place] pt. Wears CPAP nightly x 5-6hrs. Denies any problems with mask/pressure.  Needs new DME Machine is 64 years old working well. Occasional epistaxis if nose gets dry  Review of Systems  Constitutional: Negative for fever and unexpected weight change.  HENT: Negative for congestion, dental problem, ear pain, + nosebleeds, postnasal drip, rhinorrhea, sinus pressure, sneezing, sore throat and trouble swallowing.   Eyes: Negative for redness and itching.  Respiratory: Negative for cough, chest tightness, shortness of breath and wheezing.   Cardiovascular: Negative for palpitations and leg swelling.  Gastrointestinal: Negative for nausea and vomiting.  Genitourinary: Negative for dysuria.  Musculoskeletal: Negative for joint swelling.  Skin: Negative for rash.  Neurological: Negative for headaches.  Hematological: Does not bruise/bleed easily.  Psychiatric/Behavioral: Negative for dysphoric mood. The patient is not nervous/anxious.      Objective:   OBJ- Physical Exam General- Alert, Oriented, Affect-appropriate, Distress- none acute, + overweight Skin- rash-none, lesions- none, excoriation- none Lymphadenopathy- none Head- atraumatic            Eyes- Gross vision intact, PERRLA, conjunctivae and secretions clear            Ears- Hearing, canals-normal            Nose- Clear, no-Septal dev, mucus,  polyps, erosion, perforation             Throat- Mallampati IV , mucosa clear , drainage- none, tonsils- atrophic Neck- flexible , trachea midline, no stridor , thyroid nl, carotid no bruit Chest - symmetrical excursion , unlabored           Heart/CV- RRR , no murmur , no gallop  , no rub, nl s1 s2                           - JVD- none , edema- none, stasis changes- none, varices- none           Lung- clear to P&A, wheeze- none, cough- none , dullness-none, rub- none           Chest wall-  Abd-  Br/ Gen/ Rectal- Not done, not indicated Extrem- cyanosis- none, clubbing, none, atrophy- none, strength- nl Neuro- grossly intact to observation        Assessment & Plan:

## 2015-07-01 ENCOUNTER — Encounter: Payer: Self-pay | Admitting: Internal Medicine

## 2015-07-24 ENCOUNTER — Telehealth: Payer: Self-pay

## 2015-07-24 DIAGNOSIS — Z1239 Encounter for other screening for malignant neoplasm of breast: Secondary | ICD-10-CM

## 2015-07-24 DIAGNOSIS — Z1211 Encounter for screening for malignant neoplasm of colon: Secondary | ICD-10-CM

## 2015-07-24 NOTE — Telephone Encounter (Signed)
Contacted pt regarding overdue health maintenance. Will f/u with PCP on patient questions.

## 2015-07-26 NOTE — Telephone Encounter (Signed)
Contacted pt to advise her that referrals for a colonoscopy and a mammogram will be submitted with her approval. Pt agreed. Referrals submitted per written instruction from PCP.

## 2016-02-24 ENCOUNTER — Encounter: Payer: Self-pay | Admitting: Internal Medicine

## 2016-02-24 ENCOUNTER — Telehealth: Payer: Self-pay | Admitting: Internal Medicine

## 2016-02-24 NOTE — Telephone Encounter (Signed)
Please set her up sooner

## 2016-02-24 NOTE — Telephone Encounter (Signed)
Pt called to schedule a cpx.  Dr Silvio Pate next cpx 06/25/16 pt stated she needed one sooner her insurance will run out 1st week of jan

## 2016-02-24 NOTE — Telephone Encounter (Signed)
I spoke with patient and she scheduled appointment on 03/25/16.

## 2016-03-09 ENCOUNTER — Telehealth: Payer: Self-pay | Admitting: Internal Medicine

## 2016-03-09 DIAGNOSIS — G4733 Obstructive sleep apnea (adult) (pediatric): Secondary | ICD-10-CM

## 2016-03-09 NOTE — Telephone Encounter (Signed)
Spoke with the pt  She states needing order sent to [facility] for new CPAP supplies  Order sent to [facility] and per pt nothing further needed

## 2016-03-20 ENCOUNTER — Encounter: Payer: Self-pay | Admitting: Radiology

## 2016-03-20 ENCOUNTER — Ambulatory Visit
Admission: RE | Admit: 2016-03-20 | Discharge: 2016-03-20 | Disposition: A | Discharge status: Home / Self Care | Payer: Managed Care, Other (non HMO) | Source: Ambulatory Visit | Attending: Internal Medicine | Admitting: Internal Medicine

## 2016-03-20 DIAGNOSIS — Z1239 Encounter for other screening for malignant neoplasm of breast: Secondary | ICD-10-CM

## 2016-03-20 DIAGNOSIS — Z1231 Encounter for screening mammogram for malignant neoplasm of breast: Secondary | ICD-10-CM | POA: Insufficient documentation

## 2016-03-24 ENCOUNTER — Ambulatory Visit (AMBULATORY_SURGERY_CENTER): Payer: Self-pay

## 2016-03-24 VITALS — Ht 60.0 in | Wt 184.4 lb

## 2016-03-24 DIAGNOSIS — Z1211 Encounter for screening for malignant neoplasm of colon: Secondary | ICD-10-CM

## 2016-03-24 NOTE — Progress Notes (Signed)
Per pt, no allergies to soy or egg products.Pt not taking any weight loss meds or using  O2 at home. 

## 2016-03-25 ENCOUNTER — Encounter: Payer: Self-pay | Admitting: Internal Medicine

## 2016-03-25 ENCOUNTER — Ambulatory Visit (INDEPENDENT_AMBULATORY_CARE_PROVIDER_SITE_OTHER): Payer: Managed Care, Other (non HMO) | Admitting: Internal Medicine

## 2016-03-25 VITALS — BP 118/80 | HR 84 | Temp 98.2°F | Ht 60.0 in | Wt 181.0 lb

## 2016-03-25 DIAGNOSIS — Z0000 Encounter for general adult medical examination without abnormal findings: Secondary | ICD-10-CM

## 2016-03-25 DIAGNOSIS — I10 Essential (primary) hypertension: Secondary | ICD-10-CM

## 2016-03-25 DIAGNOSIS — M1000 Idiopathic gout, unspecified site: Secondary | ICD-10-CM | POA: Diagnosis not present

## 2016-03-25 DIAGNOSIS — N952 Postmenopausal atrophic vaginitis: Secondary | ICD-10-CM | POA: Diagnosis not present

## 2016-03-25 MED ORDER — ESTRADIOL 0.1 MG/GM VA CREA: 1.0000 | TOPICAL_CREAM | Freq: Every day | VAGINAL | 12 refills | Status: AC

## 2016-03-25 NOTE — Assessment & Plan Note (Signed)
Increased symptoms Will restart the estrogen cream

## 2016-03-25 NOTE — Progress Notes (Signed)
Subjective:    Patient ID: Martha Meyer, female    DOB: 1950-09-20, 65 y.o.   MRN: [id number]  HPI Here for physical  Just has ongoing itchy skin--no relief with topicals Mostly vaginal  Regardless of whether wet or dry No numbness or tingling  Has lost considerable weight-- down almost 20# Doing ketogenic diet now--discussed Tries to walk  Gout has been quiet  Off allopurinol ---hasn't needed the colchicine  Current Outpatient Prescriptions on File Prior to Visit  Medication Sig Dispense Refill  . cholecalciferol (VITAMIN D) 1000 units tablet Take 1,000 Units by mouth daily.    . colchicine 0.6 MG tablet Take 0.6 mg by mouth 2 (two) times daily as needed. For gout flares    . ibuprofen (ADVIL,MOTRIN) 200 MG tablet 2-3 tabs by mouth three times a day with food as needed for gout pain     . losartan (COZAAR) 100 MG tablet Take 1 tablet by mouth  daily 90 tablet 3  . Multiple Vitamin (MULTIVITAMIN) tablet Take 1 tablet by mouth daily.    . bisacodyl (BISACODYL) 5 MG EC tablet Take 5 mg by mouth. Dulcolax 5 mg bowel prep #4-Take as directed    . polyethylene glycol powder (MIRALAX) powder Take 1 Container by mouth. Miralax bowel prep 238 gm-Take as directed     No current facility-administered medications on file prior to visit.     Allergies  Allergen Reactions  . Bupropion Hcl     REACTION: more talkative/ agrressive  . Oxycodone-Acetaminophen     REACTION: n/v  . Paroxetine     REACTION: More talkative/agreesive    Past Medical History:  Diagnosis Date  . Depression   . Diverticulosis of colon   . GERD (gastroesophageal reflux disease)   . Gout   . Hyperlipidemia   . Hypertension   . Kidney stones   . Migraines   . Obstructive sleep apnea    uses c-pap  . Osteoarthritis    in spine and hip  . Osteoporosis     Past Surgical History:  Procedure Laterality Date  . CESAREAN SECTION     x 1  . ESOPHAGOGASTRODUODENOSCOPY  3/07   stricture dilated  .  STONE EXTRACTION WITH BASKET  6/05   [facility]  . TOTAL HIP ARTHROPLASTY  12/10   R, Dr Maureen Ralphs  . TUBAL LIGATION  1990's  . VAGINAL DELIVERY     x 1    Family History  Problem Relation Age of Onset  . Hepatitis Father     Hep C  . Hypertension Father   . Heart attack Mother   . Diabetes Mother   . Coronary artery disease Mother   . Heart failure Mother   . Hypertension Mother     ?  . Coronary artery disease Paternal Grandfather   . Heart failure Paternal Grandfather   . Diabetes Maternal Grandmother   . Breast cancer Paternal Grandmother   . Colon cancer Neg Hx     Social History   Social History  . Marital status: Married    Spouse name: N/A  . Number of children: 2  . Years of education: N/A   Occupational History  . 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 Smoking status: Never Smoker  . Smokeless tobacco: Never Used  . Alcohol use No  . Drug use: No  . Sexual activity: Not on file  Other Topics Concern  . Not on file   Social History Narrative  . No narrative on file   Review of Systems  Constitutional: Negative for fatigue.       Wears seat belt  HENT: Positive for hearing loss and tinnitus.        Has hearing aides Keeps up with dentist  Eyes: Positive for visual disturbance.       Did have episode of "dots in front of eyes"--but no problems Eye exam benign  Respiratory: Negative for cough, chest tightness and shortness of breath.   Cardiovascular: Negative for chest pain, palpitations and leg swelling.  Gastrointestinal: Positive for constipation. Negative for blood in stool, nausea and vomiting.       No heartburn  Endocrine: Negative for polydipsia and polyuria.  Genitourinary: Positive for dyspareunia and frequency. Negative for urgency.       No incontinence Needs the estrogen cream again for dyspareunia  Skin: Negative for rash.  Allergic/Immunologic: Negative for  environmental allergies and immunocompromised state.  Neurological: Negative for dizziness, syncope, light-headedness and headaches.  Hematological: Negative for adenopathy. Does not bruise/bleed easily.  Psychiatric/Behavioral: Negative for dysphoric mood. The patient is not nervous/anxious.        Frequent awakening Sleeps with CPAP       Objective:   Physical Exam  Constitutional: She is oriented to person, place, and time. She appears well-developed and well-nourished. No distress.  HENT:  Head: Normocephalic and atraumatic.  Right Ear: External ear normal.  Left Ear: External ear normal.  Mouth/Throat: Oropharynx is clear and moist.  Eyes: Conjunctivae are normal. Pupils are equal, round, and reactive to light.  Neck: Normal range of motion. Neck supple. No thyromegaly present.  Cardiovascular: Normal rate, regular rhythm, normal heart sounds and intact distal pulses.  Exam reveals no gallop.   No murmur heard. Pulmonary/Chest: Effort normal and breath sounds normal. No respiratory distress. She has no wheezes. She has no rales.  Abdominal: Soft. There is no tenderness.  Musculoskeletal: She exhibits no edema or tenderness.  Lymphadenopathy:    She has no cervical adenopathy.  Neurological: She is alert and oriented to person, place, and time.  Skin: No rash noted. No erythema.  Psychiatric: She has a normal mood and affect. Her behavior is normal.          Assessment & Plan:

## 2016-03-25 NOTE — Assessment & Plan Note (Signed)
Has worked on [employer] Discussed fitness Last pap next year Getting colonoscopy soon mammo recently

## 2016-03-25 NOTE — Assessment & Plan Note (Signed)
Quiet now without the allopurinol Has colchicine for prn

## 2016-03-25 NOTE — Assessment & Plan Note (Signed)
BP Readings from Last 3 Encounters:  03/25/16 118/80  04/30/15 (!) 142/88  12/07/14 120/80   Doing well now

## 2016-03-25 NOTE — Progress Notes (Signed)
Pre visit review using our clinic review tool, if applicable. No additional management support is needed unless otherwise documented below in the visit note. 

## 2016-03-26 LAB — COMPREHENSIVE METABOLIC PANEL
ALK PHOS: 57 IU/L (ref 39–117)
ALT: 19 IU/L (ref 0–32)
AST: 18 IU/L (ref 0–40)
Albumin/Globulin Ratio: 1.5 (ref 1.2–2.2)
Albumin: 4.2 g/dL (ref 3.6–4.8)
BUN/Creatinine Ratio: 20 (ref 12–28)
BUN: 14 mg/dL (ref 8–27)
Bilirubin Total: 0.4 mg/dL (ref 0.0–1.2)
CO2: 21 mmol/L (ref 18–29)
CREATININE: 0.69 mg/dL (ref 0.57–1.00)
Calcium: 9.7 mg/dL (ref 8.7–10.3)
Chloride: 100 mmol/L (ref 96–106)
GFR calc Af Amer: 106 mL/min/{1.73_m2} (ref >59)
GFR calc non Af Amer: 92 mL/min/{1.73_m2} (ref >59)
GLUCOSE: 86 mg/dL (ref 65–99)
Globulin, Total: 2.8 g/dL (ref 1.5–4.5)
Potassium: 4.5 mmol/L (ref 3.5–5.2)
Sodium: 142 mmol/L (ref 134–144)
Total Protein: 7 g/dL (ref 6.0–8.5)

## 2016-03-26 LAB — CBC WITH DIFFERENTIAL/PLATELET
Basophils Absolute: 0 10*3/uL (ref 0.0–0.2)
Basos: 0 %
EOS (ABSOLUTE): 0.1 10*3/uL (ref 0.0–0.4)
EOS: 2 %
HEMATOCRIT: 47.7 % — AB (ref 34.0–46.6)
Hemoglobin: 16.3 g/dL — ABNORMAL HIGH (ref 11.1–15.9)
IMMATURE GRANULOCYTES: 0 %
Immature Grans (Abs): 0 10*3/uL (ref 0.0–0.1)
Lymphocytes Absolute: 2.4 10*3/uL (ref 0.7–3.1)
Lymphs: 48 %
MCH: 29.8 pg (ref 26.6–33.0)
MCHC: 34.2 g/dL (ref 31.5–35.7)
MCV: 87 fL (ref 79–97)
MONOCYTES: 11 %
MONOS ABS: 0.6 10*3/uL (ref 0.1–0.9)
NEUTROS PCT: 39 %
Neutrophils Absolute: 2 10*3/uL (ref 1.4–7.0)
Platelets: 192 10*3/uL (ref 150–379)
RBC: 5.47 x10E6/uL — AB (ref 3.77–5.28)
RDW: 14 % (ref 12.3–15.4)
WBC: 5.1 10*3/uL (ref 3.4–10.8)

## 2016-03-27 ENCOUNTER — Encounter: Payer: Self-pay | Admitting: Internal Medicine

## 2016-03-30 ENCOUNTER — Telehealth: Payer: Self-pay | Admitting: Internal Medicine

## 2016-03-30 NOTE — Telephone Encounter (Signed)
Pt returned call back - cb number is [phone] Thanks

## 2016-03-31 NOTE — Telephone Encounter (Signed)
Spoken and notified patient of Kate's comments. Patient verbalized understanding. 

## 2016-04-09 ENCOUNTER — Ambulatory Visit (AMBULATORY_SURGERY_CENTER): Payer: Managed Care, Other (non HMO) | Admitting: Internal Medicine

## 2016-04-09 ENCOUNTER — Encounter: Payer: Self-pay | Admitting: Internal Medicine

## 2016-04-09 VITALS — BP 127/86 | HR 76 | Temp 99.1°F | Resp 28 | Ht 60.0 in | Wt 181.0 lb

## 2016-04-09 DIAGNOSIS — D123 Benign neoplasm of transverse colon: Secondary | ICD-10-CM | POA: Diagnosis not present

## 2016-04-09 DIAGNOSIS — Z1211 Encounter for screening for malignant neoplasm of colon: Secondary | ICD-10-CM | POA: Diagnosis not present

## 2016-04-09 DIAGNOSIS — Z1212 Encounter for screening for malignant neoplasm of rectum: Secondary | ICD-10-CM | POA: Diagnosis not present

## 2016-04-09 MED ORDER — SODIUM CHLORIDE 0.9 % IV SOLN: 500.0000 mL | INTRAVENOUS | Status: AC

## 2016-04-09 NOTE — Patient Instructions (Addendum)
I found and removed 2 small polyps that look benign.  You also have a condition called diverticulosis - common and not usually a problem. Please read the handout provided.  I will let you know pathology results and when to have another routine colonoscopy by mail.  I appreciate the opportunity to care for you. Gatha Mayer, MD, FACG  YOU HAD AN ENDOSCOPIC PROCEDURE TODAY AT [facility]:   Refer to the procedure report that was given to you for any specific questions about what was found during the examination.  If the procedure report does not answer your questions, please call your gastroenterologist to clarify.  If you requested that your care partner not be given the details of your procedure findings, then the procedure report has been included in a sealed envelope for you to review at your convenience later.  YOU SHOULD EXPECT: Some feelings of bloating in the abdomen. Passage of more gas than usual.  Walking can help get rid of the air that was put into your GI tract during the procedure and reduce the bloating. If you had a lower endoscopy (such as a colonoscopy or flexible sigmoidoscopy) you may notice spotting of blood in your stool or on the toilet paper. If you underwent a bowel prep for your procedure, you may not have a normal bowel movement for a few days.  Please Note:  You might notice some irritation and congestion in your nose or some drainage.  This is from the oxygen used during your procedure.  There is no need for concern and it should clear up in a day or so.  SYMPTOMS TO REPORT IMMEDIATELY:   Following lower endoscopy (colonoscopy or flexible sigmoidoscopy):  Excessive amounts of blood in the stool  Significant tenderness or worsening of abdominal pains  Swelling of the abdomen that is new, acute  Fever of 100F or higher  For urgent or emergent issues, a gastroenterologist can be reached at any hour by calling [phone].   DIET:   We do recommend a small meal at first, but then you may proceed to your regular diet.  Drink plenty of fluids but you should avoid alcoholic beverages for 24 hours.  ACTIVITY:  You should plan to take it easy for the rest of today and you should NOT DRIVE or use heavy machinery until tomorrow (because of the sedation medicines used during the test).    FOLLOW UP: Our staff will call the number listed on your records the next business day following your procedure to check on you and address any questions or concerns that you may have regarding the information given to you following your procedure. If we do not reach you, we will leave a message.  However, if you are feeling well and you are not experiencing any problems, there is no need to return our call.  We will assume that you have returned to your regular daily activities without incident.  If any biopsies were taken you will be contacted by phone or by letter within the next 1-3 weeks.  Please call us at [phone] if you have not heard about the biopsies in 3 weeks.   SIGNATURES/CONFIDENTIALITY: You and/or your care partner have signed paperwork which will be entered into your electronic medical record.  These signatures attest to the fact that that the information above on your After Visit Summary has been reviewed and is understood.  Full responsibility of the confidentiality of this discharge information lies  with you and/or your care-partner.  Please read over handouts about polyps and diverticulosis  Continue your normal medications

## 2016-04-09 NOTE — Progress Notes (Signed)
Called to room to assist during endoscopic procedure.  Patient ID and intended procedure confirmed with present staff. Received instructions for my participation in the procedure from the performing physician.  

## 2016-04-09 NOTE — Op Note (Addendum)
[facility] Patient Name: Martha Meyer Procedure Date: 04/09/2016 2:07 PM MRN: [id number] Endoscopist: Gatha Mayer , MD Age: 65 Referring MD:  Date of Birth: 08/15/1950 Gender: Female Account #: [id number] Procedure:                Colonoscopy Indications:              Screening for colorectal malignant neoplasm Medicines:                Propofol per Anesthesia, Monitored Anesthesia Care Procedure:                Pre-Anesthesia Assessment:                           - Prior to the procedure, a History and Physical                            was performed, and patient medications and                            allergies were reviewed. The patient's tolerance of                            previous anesthesia was also reviewed. The risks                            and benefits of the procedure and the sedation                            options and risks were discussed with the patient.                            All questions were answered, and informed consent                            was obtained. Prior Anticoagulants: The patient has                            taken no previous anticoagulant or antiplatelet                            agents. ASA Grade Assessment: II - A patient with                            mild systemic disease. After reviewing the risks                            and benefits, the patient was deemed in                            satisfactory condition to undergo the procedure.                           After obtaining informed consent, the colonoscope  was passed under direct vision. Throughout the                            procedure, the patient's blood pressure, pulse, and                            oxygen saturations were monitored continuously. The                            Model CF-HQ190L [id number]) scope was introduced                            through the anus and advanced to the the cecum,        identified by appendiceal orifice and ileocecal                            valve. The colonoscopy was performed without                            difficulty. The patient tolerated the procedure                            well. The quality of the bowel preparation was                            good. The bowel preparation used was Miralax. The                            ileocecal valve, appendiceal orifice, and rectum                            were photographed. Scope In: 2:17:51 PM Scope Out: 2:34:01 PM Scope Withdrawal Time: 0 hours 12 minutes 32 seconds  Total Procedure Duration: 0 hours 16 minutes 10 seconds  Findings:                 The perianal and digital rectal examinations were                            normal.                           A 5 mm polyp was found in the transverse colon. The                            polyp was sessile. The polyp was removed with a                            cold snare. Resection and retrieval were complete.                            Verification of patient identification for the  specimen was done. Estimated blood loss was minimal.                           A 2 mm polyp was found in the transverse colon. The                            polyp was sessile. The polyp was removed with a                            cold biopsy forceps. Resection and retrieval were                            complete. Verification of patient identification                            for the specimen was done. Estimated blood loss was                            minimal.                           Scattered diverticula were found in the entire                            colon.                           The exam was otherwise without abnormality on                            direct and retroflexion views. Complications:            No immediate complications. Estimated Blood Loss:     Estimated blood loss was minimal. Impression:                - One 5 mm polyp in the transverse colon, removed                            with a cold snare. Resected and retrieved.                           - One 2 mm polyp in the transverse colon, removed                            with a cold biopsy forceps. Resected and retrieved.                           - Diverticulosis in the entire examined colon.                           - The examination was otherwise normal on direct                            and retroflexion views. Recommendation:           -  Patient has a contact number available for                            emergencies. The signs and symptoms of potential                            delayed complications were discussed with the                            patient. Return to normal activities tomorrow.                            Written discharge instructions were provided to the                            patient.                           - Resume previous diet.                           - Continue present medications.                           - Repeat colonoscopy is recommended. The                            colonoscopy date will be determined after pathology                            results from today's exam become available for                            review. Gatha Mayer, MD 04/09/2016 2:42:52 PM This report has been signed electronically.

## 2016-04-09 NOTE — Progress Notes (Signed)
Report to PACU, RN, vss, BBS= Clear.  

## 2016-04-10 ENCOUNTER — Telehealth: Payer: Self-pay

## 2016-04-10 NOTE — Telephone Encounter (Signed)
  Follow up Call-  Call back number 04/09/2016  Post procedure Call Back phone  # [phone]  Permission to leave phone message Yes  Some recent data might be hidden     Patient questions:  Do you have a fever, pain , or abdominal swelling? No. Pain Score  0 *  Have you tolerated food without any problems? Yes.    Have you been able to return to your normal activities? Yes.    Do you have any questions about your discharge instructions: Diet   No. Medications  No. Follow up visit  No.  Do you have questions or concerns about your Care? No.  Actions: * If pain score is 4 or above: No action needed, pain <4.

## 2016-04-13 ENCOUNTER — Other Ambulatory Visit: Payer: Self-pay | Admitting: Internal Medicine

## 2016-04-14 ENCOUNTER — Encounter: Payer: Self-pay | Admitting: Internal Medicine

## 2016-04-14 DIAGNOSIS — Z86010 Personal history of colonic polyps: Secondary | ICD-10-CM

## 2016-04-14 DIAGNOSIS — Z860101 Personal history of adenomatous and serrated colon polyps: Secondary | ICD-10-CM

## 2016-04-14 HISTORY — DX: Personal history of colonic polyps: Z86.010

## 2016-04-14 HISTORY — DX: Personal history of adenomatous and serrated colon polyps: Z86.0101

## 2016-04-14 NOTE — Progress Notes (Signed)
2 adenomas < 1 cm recall 2022

## 2016-04-29 ENCOUNTER — Ambulatory Visit (INDEPENDENT_AMBULATORY_CARE_PROVIDER_SITE_OTHER): Payer: Managed Care, Other (non HMO) | Admitting: Internal Medicine

## 2016-04-29 ENCOUNTER — Encounter: Payer: Self-pay | Admitting: Internal Medicine

## 2016-04-29 VITALS — BP 126/78 | HR 84 | Ht 61.5 in | Wt 195.0 lb

## 2016-04-29 DIAGNOSIS — G4733 Obstructive sleep apnea (adult) (pediatric): Secondary | ICD-10-CM

## 2016-04-29 NOTE — Patient Instructions (Signed)
Order- [facility]- Please continue CPAP current pressure, mask of choice, supplies, humidifier. Please offer chin strap and please fix or replace card or add AirView so we can obtain pressure compliance downloads. Current card is not recording.  Please call as needed

## 2016-04-29 NOTE — Assessment & Plan Note (Addendum)
We are still needing download documentation. Her card is blank swelling the last DME [facility] to check this or provide AirView. She describes good compliance and control. She would like to have a chinstrap, although she uses a fullface mask.

## 2016-04-29 NOTE — Progress Notes (Signed)
   Subjective:    Patient ID: Martha Meyer, female    DOB: 1951/04/15, 65 y.o.   MRN: [id number]  HPI 03/08/2014- Dr Gwenette Greet The patient comes in today for followup of her obstructive sleep apnea. She is wearing CPAP compliantly, and is having no issues with her mask fit or pressure. She feels that she sleeps we simply well, and is satisfied with her daytime alertness. He should be her weight is unchanged from the previous visit.  04/30/2015-65 year old female never smoker followed for OSA, complicated by HBP NPSG [id number], AHI 13/ hr CPAP auto/ [facility]- need new DME FOLLOWS FOR: Former [place] pt. Wears CPAP nightly x 5-6hrs. Denies any problems with mask/pressure.  Needs new DME Machine is 65 years old working well. Occasional epistaxis if nose gets dry  04/29/2016-65 year old female never smoker followed for OSA, complicated by HBP CPAP Auto/ [facility] FOLLOWS FOR: Currently using CPAP every night for 8 hours. Denies issues with machine, mask or pressure. DME is [facility]. Now working with [facility]. No problems. Sleeps better with CPAP. Intends to lose some weight which we discussed. No download available-her card is blank.  Review of Systems  Constitutional: Negative for fever and unexpected weight change.  HENT: Negative for congestion, dental problem, ear pain,  nosebleeds, postnasal drip, rhinorrhea, sinus pressure, sneezing, sore throat and trouble swallowing.   Eyes: Negative for redness and itching.  Respiratory: Negative for cough, chest tightness, shortness of breath and wheezing.   Cardiovascular: Negative for palpitations and leg swelling.  Gastrointestinal: Negative for nausea and vomiting.  Genitourinary: Negative for dysuria.  Musculoskeletal: Negative for joint swelling.  Skin: Negative for rash.  Neurological: Negative for headaches.  Hematological: Does not bruise/bleed easily.  Psychiatric/Behavioral: Negative for dysphoric mood. The patient is not nervous/anxious.     Objective:   OBJ- Physical Exam General- Alert, Oriented, Affect-appropriate, Distress- none acute, + overweight Skin- rash-none, lesions- none, excoriation- none Lymphadenopathy- none Head- atraumatic            Eyes- Gross vision intact, PERRLA, conjunctivae and secretions clear            Ears- Hearing, canals-normal            Nose- Clear, no-Septal dev, mucus, polyps, erosion, perforation             Throat- Mallampati IV , mucosa clear , drainage- none, tonsils- atrophic Neck- flexible , trachea midline, no stridor , thyroid nl, carotid no bruit Chest - symmetrical excursion , unlabored           Heart/CV- RRR , no murmur , no gallop  , no rub, nl s1 s2                           - JVD- none , edema- none, stasis changes- none, varices- none           Lung- clear to P&A, wheeze- none, cough- none , dullness-none, rub- none           Chest wall-  Abd-  Br/ Gen/ Rectal- Not done, not indicated Extrem- cyanosis- none, clubbing, none, atrophy- none, strength- nl Neuro- grossly intact to observation     Assessment & Plan:

## 2016-06-03 ENCOUNTER — Encounter (HOSPITAL_COMMUNITY): Admission: EM | Disposition: E | Payer: Self-pay | Source: Home / Self Care | Attending: Internal Medicine

## 2016-06-03 ENCOUNTER — Emergency Department (HOSPITAL_COMMUNITY): Payer: Managed Care, Other (non HMO) | Admitting: Anesthesiology

## 2016-06-03 ENCOUNTER — Emergency Department (HOSPITAL_COMMUNITY): Payer: Managed Care, Other (non HMO)

## 2016-06-03 ENCOUNTER — Encounter (HOSPITAL_COMMUNITY): Payer: Self-pay | Admitting: Internal Medicine

## 2016-06-03 ENCOUNTER — Inpatient Hospital Stay (HOSPITAL_COMMUNITY)
Admission: EM | Admit: 2016-06-03 | Discharge: 2016-07-02 | DRG: 233 | Disposition: E | Payer: Managed Care, Other (non HMO) | Attending: Internal Medicine | Admitting: Internal Medicine

## 2016-06-03 DIAGNOSIS — Z888 Allergy status to other drugs, medicaments and biological substances status: Secondary | ICD-10-CM

## 2016-06-03 DIAGNOSIS — Z96641 Presence of right artificial hip joint: Secondary | ICD-10-CM | POA: Diagnosis present

## 2016-06-03 DIAGNOSIS — I7101 Dissection of thoracic aorta: Secondary | ICD-10-CM

## 2016-06-03 DIAGNOSIS — G43909 Migraine, unspecified, not intractable, without status migrainosus: Secondary | ICD-10-CM | POA: Diagnosis present

## 2016-06-03 DIAGNOSIS — I2510 Atherosclerotic heart disease of native coronary artery without angina pectoris: Secondary | ICD-10-CM | POA: Diagnosis present

## 2016-06-03 DIAGNOSIS — I10 Essential (primary) hypertension: Secondary | ICD-10-CM | POA: Diagnosis present

## 2016-06-03 DIAGNOSIS — I7102 Dissection of abdominal aorta: Secondary | ICD-10-CM | POA: Diagnosis present

## 2016-06-03 DIAGNOSIS — M1990 Unspecified osteoarthritis, unspecified site: Secondary | ICD-10-CM | POA: Diagnosis present

## 2016-06-03 DIAGNOSIS — Z79899 Other long term (current) drug therapy: Secondary | ICD-10-CM

## 2016-06-03 DIAGNOSIS — M109 Gout, unspecified: Secondary | ICD-10-CM | POA: Diagnosis present

## 2016-06-03 DIAGNOSIS — R079 Chest pain, unspecified: Secondary | ICD-10-CM | POA: Diagnosis present

## 2016-06-03 DIAGNOSIS — K219 Gastro-esophageal reflux disease without esophagitis: Secondary | ICD-10-CM | POA: Diagnosis present

## 2016-06-03 DIAGNOSIS — F329 Major depressive disorder, single episode, unspecified: Secondary | ICD-10-CM | POA: Diagnosis present

## 2016-06-03 DIAGNOSIS — I97710 Intraoperative cardiac arrest during cardiac surgery: Secondary | ICD-10-CM | POA: Diagnosis present

## 2016-06-03 DIAGNOSIS — E785 Hyperlipidemia, unspecified: Secondary | ICD-10-CM | POA: Diagnosis present

## 2016-06-03 DIAGNOSIS — Z8249 Family history of ischemic heart disease and other diseases of the circulatory system: Secondary | ICD-10-CM

## 2016-06-03 DIAGNOSIS — I4901 Ventricular fibrillation: Secondary | ICD-10-CM | POA: Diagnosis present

## 2016-06-03 DIAGNOSIS — Y92238 Other place in hospital as the place of occurrence of the external cause: Secondary | ICD-10-CM | POA: Diagnosis present

## 2016-06-03 DIAGNOSIS — Z6830 Body mass index (BMI) 30.0-30.9, adult: Secondary | ICD-10-CM

## 2016-06-03 DIAGNOSIS — M810 Age-related osteoporosis without current pathological fracture: Secondary | ICD-10-CM | POA: Diagnosis present

## 2016-06-03 DIAGNOSIS — G4733 Obstructive sleep apnea (adult) (pediatric): Secondary | ICD-10-CM | POA: Diagnosis present

## 2016-06-03 DIAGNOSIS — I71019 Dissection of thoracic aorta, unspecified: Secondary | ICD-10-CM

## 2016-06-03 DIAGNOSIS — I213 ST elevation (STEMI) myocardial infarction of unspecified site: Secondary | ICD-10-CM

## 2016-06-03 DIAGNOSIS — I2119 ST elevation (STEMI) myocardial infarction involving other coronary artery of inferior wall: Secondary | ICD-10-CM | POA: Diagnosis present

## 2016-06-03 DIAGNOSIS — Y848 Other medical procedures as the cause of abnormal reaction of the patient, or of later complication, without mention of misadventure at the time of the procedure: Secondary | ICD-10-CM | POA: Diagnosis present

## 2016-06-03 DIAGNOSIS — I351 Nonrheumatic aortic (valve) insufficiency: Secondary | ICD-10-CM | POA: Diagnosis present

## 2016-06-03 DIAGNOSIS — E669 Obesity, unspecified: Secondary | ICD-10-CM | POA: Diagnosis present

## 2016-06-03 DIAGNOSIS — I442 Atrioventricular block, complete: Secondary | ICD-10-CM | POA: Diagnosis present

## 2016-06-03 DIAGNOSIS — Z86010 Personal history of colonic polyps: Secondary | ICD-10-CM

## 2016-06-03 HISTORY — PX: CARDIAC CATHETERIZATION: SHX172

## 2016-06-03 HISTORY — PX: REPAIR OF ACUTE ASCENDING THORACIC AORTIC DISSECTION: SHX6323

## 2016-06-03 LAB — I-STAT CHEM 8, ED
BUN: 18 mg/dL (ref 6–20)
CHLORIDE: 105 mmol/L (ref 101–111)
Calcium, Ion: 1.04 mmol/L — ABNORMAL LOW (ref 1.15–1.40)
Creatinine, Ser: 1 mg/dL (ref 0.44–1.00)
GLUCOSE: 141 mg/dL — AB (ref 65–99)
HCT: 47 % — ABNORMAL HIGH (ref 36.0–46.0)
Hemoglobin: 16 g/dL — ABNORMAL HIGH (ref 12.0–15.0)
POTASSIUM: 3.1 mmol/L — AB (ref 3.5–5.1)
Sodium: 141 mmol/L (ref 135–145)
TCO2: 25 mmol/L (ref 0–100)

## 2016-06-03 LAB — APTT: aPTT: 35 seconds (ref 24–36)

## 2016-06-03 LAB — COMPREHENSIVE METABOLIC PANEL
ALK PHOS: 62 U/L (ref 38–126)
ALT: 25 U/L (ref 14–54)
ANION GAP: 13 (ref 5–15)
AST: 30 U/L (ref 15–41)
Albumin: 3.6 g/dL (ref 3.5–5.0)
BUN: 13 mg/dL (ref 6–20)
CALCIUM: 9 mg/dL (ref 8.9–10.3)
CO2: 20 mmol/L — ABNORMAL LOW (ref 22–32)
Chloride: 106 mmol/L (ref 101–111)
Creatinine, Ser: 1.07 mg/dL — ABNORMAL HIGH (ref 0.44–1.00)
GFR, EST NON AFRICAN AMERICAN: 54 mL/min — AB (ref >60)
GLUCOSE: 140 mg/dL — AB (ref 65–99)
Potassium: 3 mmol/L — ABNORMAL LOW (ref 3.5–5.1)
Sodium: 139 mmol/L (ref 135–145)
TOTAL PROTEIN: 6.3 g/dL — AB (ref 6.5–8.1)
Total Bilirubin: 1 mg/dL (ref 0.3–1.2)

## 2016-06-03 LAB — LIPID PANEL
CHOLESTEROL: 235 mg/dL — AB (ref 0–200)
HDL: 51 mg/dL (ref >40)
LDL CALC: 137 mg/dL — AB (ref 0–99)
TRIGLYCERIDES: 233 mg/dL — AB (ref <150)
Total CHOL/HDL Ratio: 4.6 RATIO
VLDL: 47 mg/dL — ABNORMAL HIGH (ref 0–40)

## 2016-06-03 LAB — CBC
HCT: 47 % — ABNORMAL HIGH (ref 36.0–46.0)
HEMOGLOBIN: 15.9 g/dL — AB (ref 12.0–15.0)
MCH: 30.1 pg (ref 26.0–34.0)
MCHC: 33.8 g/dL (ref 30.0–36.0)
MCV: 89 fL (ref 78.0–100.0)
Platelets: 162 10*3/uL (ref 150–400)
RBC: 5.28 MIL/uL — AB (ref 3.87–5.11)
RDW: 14.2 % (ref 11.5–15.5)
WBC: 9.5 10*3/uL (ref 4.0–10.5)

## 2016-06-03 LAB — DIFFERENTIAL
BASOS PCT: 0 %
Basophils Absolute: 0 10*3/uL (ref 0.0–0.1)
EOS PCT: 1 %
Eosinophils Absolute: 0.1 10*3/uL (ref 0.0–0.7)
Lymphocytes Relative: 39 %
Lymphs Abs: 3.7 10*3/uL (ref 0.7–4.0)
MONO ABS: 0.5 10*3/uL (ref 0.1–1.0)
Monocytes Relative: 6 %
Neutro Abs: 5.1 10*3/uL (ref 1.7–7.7)
Neutrophils Relative %: 54 %

## 2016-06-03 LAB — POCT ACTIVATED CLOTTING TIME: ACTIVATED CLOTTING TIME: 208 s

## 2016-06-03 LAB — HEMOGLOBIN AND HEMATOCRIT, BLOOD
HCT: 20.2 % — ABNORMAL LOW (ref 36.0–46.0)
Hemoglobin: 6.8 g/dL — CL (ref 12.0–15.0)

## 2016-06-03 LAB — PREPARE RBC (CROSSMATCH)

## 2016-06-03 LAB — PROTIME-INR
INR: 1.03
PROTHROMBIN TIME: 13.5 s (ref 11.4–15.2)

## 2016-06-03 LAB — TROPONIN I: Troponin I: <0.03 ng/mL (ref <0.03)

## 2016-06-03 LAB — PLATELET COUNT: Platelets: 27 10*3/uL — CL (ref 150–400)

## 2016-06-03 LAB — ABO/RH: ABO/RH(D): A POS

## 2016-06-03 LAB — POCT I-STAT TROPONIN I: TROPONIN I, POC: 0.01 ng/mL (ref 0.00–0.08)

## 2016-06-03 SURGERY — REPAIR, AORTIC DISSECTION, ASCENDING
Anesthesia: General | Site: Chest

## 2016-06-03 SURGERY — LEFT HEART CATH AND CORONARY ANGIOGRAPHY
Anesthesia: LOCAL

## 2016-06-03 MED ORDER — PLASMA-LYTE 148 IV SOLN
INTRAVENOUS | Status: AC
  Administered 2016-06-03: 500 mL
  Filled 2016-06-03: qty 2.5

## 2016-06-03 MED ORDER — DEXTROSE 5 % IV SOLN
INTRAVENOUS | Status: DC | PRN
  Administered 2016-06-03: 12 ug/min via INTRAVENOUS

## 2016-06-03 MED ORDER — ROCURONIUM BROMIDE 50 MG/5ML IV SOSY
PREFILLED_SYRINGE | INTRAVENOUS | Status: AC
  Filled 2016-06-03: qty 5

## 2016-06-03 MED ORDER — TRANEXAMIC ACID 1000 MG/10ML IV SOLN
1.5000 mg/kg/h | INTRAVENOUS | Status: AC
  Administered 2016-06-03: 1.5 mg/kg/h via INTRAVENOUS
  Filled 2016-06-03: qty 25

## 2016-06-03 MED ORDER — 0.9 % SODIUM CHLORIDE (POUR BTL) OPTIME
TOPICAL | Status: DC | PRN
  Administered 2016-06-03: 6000 mL

## 2016-06-03 MED ORDER — LACTATED RINGERS IV SOLN
INTRAVENOUS | Status: DC | PRN
  Administered 2016-06-03: 16:00:00 via INTRAVENOUS

## 2016-06-03 MED ORDER — ETOMIDATE 2 MG/ML IV SOLN
INTRAVENOUS | Status: AC
  Filled 2016-06-03: qty 20

## 2016-06-03 MED ORDER — SODIUM CHLORIDE 0.9 % IV SOLN
INTRAVENOUS | Status: AC
  Administered 2016-06-03: 4.8 [IU]/h via INTRAVENOUS
  Filled 2016-06-03: qty 2.5

## 2016-06-03 MED ORDER — PROPOFOL 10 MG/ML IV BOLUS
INTRAVENOUS | Status: DC | PRN
  Administered 2016-06-03: 50 mg via INTRAVENOUS

## 2016-06-03 MED ORDER — HEMOSTATIC AGENTS (NO CHARGE) OPTIME
TOPICAL | Status: DC | PRN
  Administered 2016-06-03: 1 via TOPICAL

## 2016-06-03 MED ORDER — MIDAZOLAM HCL 2 MG/2ML IJ SOLN
INTRAMUSCULAR | Status: AC
  Filled 2016-06-03: qty 2

## 2016-06-03 MED ORDER — DOPAMINE-DEXTROSE 3.2-5 MG/ML-% IV SOLN
0.0000 ug/kg/min | INTRAVENOUS | Status: AC
  Administered 2016-06-03: 3 ug/kg/min via INTRAVENOUS
  Filled 2016-06-03: qty 250

## 2016-06-03 MED ORDER — MIDAZOLAM HCL 10 MG/2ML IJ SOLN
INTRAMUSCULAR | Status: AC
  Filled 2016-06-03: qty 2

## 2016-06-03 MED ORDER — FENTANYL CITRATE (PF) 100 MCG/2ML IJ SOLN
INTRAMUSCULAR | Status: AC
  Filled 2016-06-03: qty 2

## 2016-06-03 MED ORDER — AMIODARONE HCL IN DEXTROSE 360-4.14 MG/200ML-% IV SOLN
INTRAVENOUS | Status: AC
  Filled 2016-06-03: qty 200

## 2016-06-03 MED ORDER — MAGNESIUM SULFATE 50 % IJ SOLN
40.0000 meq | INTRAMUSCULAR | Status: DC
  Filled 2016-06-03: qty 10

## 2016-06-03 MED ORDER — EPINEPHRINE PF 1 MG/ML IJ SOLN
0.0000 ug/min | INTRAVENOUS | Status: AC
  Administered 2016-06-04: 3 ug/min via INTRAVENOUS
  Filled 2016-06-03: qty 4

## 2016-06-03 MED ORDER — FENTANYL CITRATE (PF) 100 MCG/2ML IJ SOLN
INTRAMUSCULAR | Status: DC | PRN
  Administered 2016-06-03: 25 ug via INTRAVENOUS

## 2016-06-03 MED ORDER — AMIODARONE HCL 150 MG/3ML IV SOLN
INTRAVENOUS | Status: AC
  Filled 2016-06-03: qty 3

## 2016-06-03 MED ORDER — AMIODARONE HCL IN DEXTROSE 360-4.14 MG/200ML-% IV SOLN
60.0000 mg/h | INTRAVENOUS | Status: AC
  Administered 2016-06-03: 30 mg/h via INTRAVENOUS

## 2016-06-03 MED ORDER — ROCURONIUM BROMIDE 100 MG/10ML IV SOLN
INTRAVENOUS | Status: DC | PRN
  Administered 2016-06-03 (×3): 50 mg via INTRAVENOUS

## 2016-06-03 MED ORDER — HEPARIN (PORCINE) IN NACL 2-0.9 UNIT/ML-% IJ SOLN
INTRAMUSCULAR | Status: DC | PRN
  Administered 2016-06-03: 10 mL via INTRA_ARTERIAL

## 2016-06-03 MED ORDER — HEPARIN SODIUM (PORCINE) 1000 UNIT/ML IJ SOLN
INTRAMUSCULAR | Status: DC | PRN
  Administered 2016-06-03: 25 mL via INTRAVENOUS

## 2016-06-03 MED ORDER — HEPARIN (PORCINE) IN NACL 2-0.9 UNIT/ML-% IJ SOLN
INTRAMUSCULAR | Status: DC | PRN
  Administered 2016-06-03: 16:00:00

## 2016-06-03 MED ORDER — FENTANYL CITRATE (PF) 250 MCG/5ML IJ SOLN
INTRAMUSCULAR | Status: AC
  Filled 2016-06-03: qty 10

## 2016-06-03 MED ORDER — NOREPINEPHRINE BITARTRATE 1 MG/ML IV SOLN
0.0000 ug/min | INTRAVENOUS | Status: DC
  Filled 2016-06-03: qty 16

## 2016-06-03 MED ORDER — VANCOMYCIN HCL 10 G IV SOLR
1500.0000 mg | INTRAVENOUS | Status: AC
  Administered 2016-06-03: 1500 mg via INTRAVENOUS
  Filled 2016-06-03: qty 1500

## 2016-06-03 MED ORDER — SODIUM CHLORIDE 0.9 % IV SOLN: Freq: Once | INTRAVENOUS | Status: DC

## 2016-06-03 MED ORDER — VASOPRESSIN 20 UNIT/ML IV SOLN
0.0300 [IU]/min | INTRAVENOUS | Status: DC
  Administered 2016-06-04: 0.03 [IU]/min via INTRAVENOUS
  Filled 2016-06-03: qty 2

## 2016-06-03 MED ORDER — IOPAMIDOL (ISOVUE-370) INJECTION 76%
INTRAVENOUS | Status: DC | PRN
  Administered 2016-06-03: 75 mL

## 2016-06-03 MED ORDER — TRANEXAMIC ACID (OHS) BOLUS VIA INFUSION
15.0000 mg/kg | INTRAVENOUS | Status: DC
  Filled 2016-06-03: qty 1224

## 2016-06-03 MED ORDER — LIDOCAINE HCL (PF) 1 % IJ SOLN
INTRAMUSCULAR | Status: DC | PRN
  Administered 2016-06-03: 2 mL
  Administered 2016-06-03: 15 mL
  Administered 2016-06-03: 5 mL

## 2016-06-03 MED ORDER — MILRINONE LACTATE IN DEXTROSE 20-5 MG/100ML-% IV SOLN
0.1250 ug/kg/min | INTRAVENOUS | Status: DC
  Administered 2016-06-03: 0.375 ug/kg/min via INTRAVENOUS
  Filled 2016-06-03: qty 100

## 2016-06-03 MED ORDER — HEMOSTATIC AGENTS (NO CHARGE) OPTIME
TOPICAL | Status: DC | PRN
  Administered 2016-06-03: 2 via TOPICAL

## 2016-06-03 MED ORDER — LIDOCAINE HCL (PF) 1 % IJ SOLN
INTRAMUSCULAR | Status: AC
Start: 2016-06-03 — End: 2016-06-03
  Filled 2016-06-03: qty 30

## 2016-06-03 MED ORDER — EPINEPHRINE PF 1 MG/10ML IJ SOSY
PREFILLED_SYRINGE | INTRAMUSCULAR | Status: AC
  Filled 2016-06-03: qty 10

## 2016-06-03 MED ORDER — SODIUM CHLORIDE 0.9 % IV SOLN
INTRAVENOUS | Status: DC
  Filled 2016-06-03: qty 30

## 2016-06-03 MED ORDER — DEXTROSE 5 % IV SOLN
1.5000 g | INTRAVENOUS | Status: AC
  Administered 2016-06-03: 1.5 g via INTRAVENOUS
  Filled 2016-06-03: qty 1.5

## 2016-06-03 MED ORDER — PHENYLEPHRINE HCL 10 MG/ML IJ SOLN
30.0000 ug/min | INTRAVENOUS | Status: DC
  Filled 2016-06-03: qty 2

## 2016-06-03 MED ORDER — ETOMIDATE 2 MG/ML IV SOLN
INTRAVENOUS | Status: DC | PRN
  Administered 2016-06-03 (×2): 10 mg via INTRAVENOUS

## 2016-06-03 MED ORDER — MIDAZOLAM HCL 5 MG/5ML IJ SOLN
INTRAMUSCULAR | Status: DC | PRN
  Administered 2016-06-03: 1 mg via INTRAVENOUS
  Administered 2016-06-03: 5 mg via INTRAVENOUS
  Administered 2016-06-03: 4 mg via INTRAVENOUS

## 2016-06-03 MED ORDER — HEPARIN SODIUM (PORCINE) 1000 UNIT/ML IJ SOLN
INTRAMUSCULAR | Status: AC
  Filled 2016-06-03: qty 1

## 2016-06-03 MED ORDER — DEXMEDETOMIDINE HCL IN NACL 400 MCG/100ML IV SOLN
0.1000 ug/kg/h | INTRAVENOUS | Status: AC
  Administered 2016-06-03: .2 ug/kg/h via INTRAVENOUS
  Filled 2016-06-03: qty 100

## 2016-06-03 MED ORDER — AMIODARONE HCL IN DEXTROSE 360-4.14 MG/200ML-% IV SOLN
30.0000 mg/h | INTRAVENOUS | Status: DC
  Administered 2016-06-03: 30 mg/h via INTRAVENOUS

## 2016-06-03 MED ORDER — NITROGLYCERIN IN D5W 200-5 MCG/ML-% IV SOLN
2.0000 ug/min | INTRAVENOUS | Status: AC
  Administered 2016-06-03: 10 ug/min via INTRAVENOUS
  Filled 2016-06-03: qty 250

## 2016-06-03 MED ORDER — SODIUM BICARBONATE 8.4 % IV SOLN
INTRAVENOUS | Status: AC
  Filled 2016-06-03: qty 50

## 2016-06-03 MED ORDER — SODIUM CHLORIDE 0.9 % IV SOLN
INTRAVENOUS | Status: DC | PRN
  Administered 2016-06-03: 90 mL/h via INTRAVENOUS
  Administered 2016-06-03: 500 mL

## 2016-06-03 MED ORDER — HEPARIN (PORCINE) IN NACL 2-0.9 UNIT/ML-% IJ SOLN
INTRAMUSCULAR | Status: AC
  Filled 2016-06-03: qty 1000

## 2016-06-03 MED ORDER — HEPARIN SODIUM (PORCINE) 1000 UNIT/ML IJ SOLN
INTRAMUSCULAR | Status: DC | PRN
  Administered 2016-06-03: 5000 [IU] via INTRAVENOUS

## 2016-06-03 MED ORDER — SODIUM BICARBONATE 8.4 % IV SOLN
INTRAVENOUS | Status: DC | PRN
  Administered 2016-06-03: 100 mL via INTRAVENOUS
  Administered 2016-06-04: 50 mL via INTRAVENOUS

## 2016-06-03 MED ORDER — TRANEXAMIC ACID (OHS) PUMP PRIME SOLUTION
2.0000 mg/kg | INTRAVENOUS | Status: DC
  Filled 2016-06-03: qty 1.63

## 2016-06-03 MED ORDER — ATROPINE SULFATE 1 MG/10ML IJ SOSY
PREFILLED_SYRINGE | INTRAMUSCULAR | Status: AC
  Filled 2016-06-03: qty 10

## 2016-06-03 MED ORDER — POTASSIUM CHLORIDE 2 MEQ/ML IV SOLN
80.0000 meq | INTRAVENOUS | Status: DC
  Filled 2016-06-03: qty 40

## 2016-06-03 MED ORDER — MIDAZOLAM HCL 2 MG/2ML IJ SOLN
INTRAMUSCULAR | Status: DC | PRN
  Administered 2016-06-03: 1.5 mg via INTRAVENOUS
  Administered 2016-06-03: 0.5 mg via INTRAVENOUS

## 2016-06-03 MED ORDER — IOPAMIDOL (ISOVUE-370) INJECTION 76%
INTRAVENOUS | Status: AC
Start: 2016-06-03 — End: 2016-06-03
  Filled 2016-06-03: qty 125

## 2016-06-03 MED ORDER — DEXTROSE 5 % IV SOLN
750.0000 mg | INTRAVENOUS | Status: AC
  Administered 2016-06-04: .75 mg via INTRAVENOUS
  Filled 2016-06-03: qty 750

## 2016-06-03 MED ORDER — PROTAMINE SULFATE 10 MG/ML IV SOLN
INTRAVENOUS | Status: AC
  Filled 2016-06-03: qty 25

## 2016-06-03 MED ORDER — NITROPRUSSIDE SODIUM 25 MG/ML IV SOLN
0.0000 ug/kg/min | INTRAVENOUS | Status: DC
  Filled 2016-06-03: qty 2

## 2016-06-03 MED ORDER — FENTANYL CITRATE (PF) 100 MCG/2ML IJ SOLN
INTRAMUSCULAR | Status: DC | PRN
  Administered 2016-06-03: 150 ug via INTRAVENOUS
  Administered 2016-06-03: 100 ug via INTRAVENOUS
  Administered 2016-06-03: 250 ug via INTRAVENOUS

## 2016-06-03 MED ORDER — VERAPAMIL HCL 2.5 MG/ML IV SOLN
INTRAVENOUS | Status: AC
  Filled 2016-06-03: qty 2

## 2016-06-03 MED ORDER — AMIODARONE HCL 150 MG/3ML IV SOLN
INTRAVENOUS | Status: DC | PRN
  Administered 2016-06-03: 300 mg via INTRAVENOUS

## 2016-06-03 SURGICAL SUPPLY — 122 items
APPLICATOR TIP COSEAL (VASCULAR PRODUCTS) ×8 IMPLANT
BAG DECANTER FOR FLEXI CONT (MISCELLANEOUS) ×2 IMPLANT
BANDAGE ACE 4X5 VEL STRL LF (GAUZE/BANDAGES/DRESSINGS) ×2 IMPLANT
BANDAGE ACE 6X5 VEL STRL LF (GAUZE/BANDAGES/DRESSINGS) ×2 IMPLANT
BASKET HEART (ORDER IN 25'S) (MISCELLANEOUS) ×1
BASKET HEART (ORDER IN 25S) (MISCELLANEOUS) ×1 IMPLANT
BLADE STERNUM SYSTEM 6 (BLADE) ×2 IMPLANT
BNDG GAUZE ELAST 4 BULKY (GAUZE/BANDAGES/DRESSINGS) ×2 IMPLANT
CANISTER SUCTION 2500CC (MISCELLANEOUS) ×2 IMPLANT
CANN PRFSN .5XCNCT 15X34-48 (MISCELLANEOUS) ×1
CANNULA EZ GLIDE AORTIC 21FR (CANNULA) ×4 IMPLANT
CANNULA GRAFT 8MMX50CM (Graft) IMPLANT
CANNULA PRFSN .5XCNCT 15X34-48 (MISCELLANEOUS) ×1 IMPLANT
CANNULA SUMP PERICARDIAL (CANNULA) ×2 IMPLANT
CANNULA VEN 2 STAGE (MISCELLANEOUS) ×1
CATH CPB KIT HENDRICKSON (MISCELLANEOUS) ×2 IMPLANT
CATH FOLEY 2WAY SLVR 18FR 30CC (CATHETERS) ×2 IMPLANT
CATH HEART VENT LEFT (CATHETERS) ×1 IMPLANT
CATH ROBINSON RED A/P 18FR (CATHETERS) ×2 IMPLANT
CATH THORACIC 36FR (CATHETERS) ×2 IMPLANT
CATH THORACIC 36FR RT ANG (CATHETERS) ×2 IMPLANT
CLIP TI MEDIUM 24 (CLIP) IMPLANT
CLIP TI WIDE RED SMALL 24 (CLIP) IMPLANT
CONT SPEC 4OZ CLIKSEAL STRL BL (MISCELLANEOUS) ×4 IMPLANT
COVER MAYO STAND STRL (DRAPES) ×2 IMPLANT
CRADLE DONUT ADULT HEAD (MISCELLANEOUS) ×2 IMPLANT
DERMABOND ADVANCED (GAUZE/BANDAGES/DRESSINGS) ×1
DERMABOND ADVANCED .7 DNX12 (GAUZE/BANDAGES/DRESSINGS) ×1 IMPLANT
DRAPE CARDIOVASCULAR INCISE (DRAPES) ×1
DRAPE SLUSH/WARMER DISC (DRAPES) ×2 IMPLANT
DRAPE SRG 135X102X78XABS (DRAPES) ×1 IMPLANT
DRSG COVADERM 4X14 (GAUZE/BANDAGES/DRESSINGS) ×2 IMPLANT
ELECT BLADE 4.0 EZ CLEAN MEGAD (MISCELLANEOUS) ×2
ELECT REM PT RETURN 9FT ADLT (ELECTROSURGICAL) ×4
ELECTRODE BLDE 4.0 EZ CLN MEGD (MISCELLANEOUS) ×1 IMPLANT
ELECTRODE REM PT RTRN 9FT ADLT (ELECTROSURGICAL) ×2 IMPLANT
EVACUATOR SILICONE 100CC (DRAIN) ×2 IMPLANT
FELT TEFLON 1X6 (MISCELLANEOUS) ×6 IMPLANT
GAUZE SPONGE 4X4 12PLY STRL (GAUZE/BANDAGES/DRESSINGS) ×4 IMPLANT
GLOVE BIO SURGEON STRL SZ 6.5 (GLOVE) ×20 IMPLANT
GLOVE BIO SURGEON STRL SZ7 (GLOVE) ×4 IMPLANT
GLOVE BIOGEL PI IND STRL 7.0 (GLOVE) ×3 IMPLANT
GLOVE BIOGEL PI INDICATOR 7.0 (GLOVE) ×3
GLOVE SURG SIGNA 7.5 PF LTX (GLOVE) IMPLANT
GOWN STRL REUS W/ TWL LRG LVL3 (GOWN DISPOSABLE) ×12 IMPLANT
GOWN STRL REUS W/ TWL XL LVL3 (GOWN DISPOSABLE) IMPLANT
GOWN STRL REUS W/TWL LRG LVL3 (GOWN DISPOSABLE) ×12
GOWN STRL REUS W/TWL XL LVL3 (GOWN DISPOSABLE)
GRAFT 4 BRANCH 28X50 (Prosthesis & Implant Heart) ×2 IMPLANT
GRAFT WOVEN D/V 28DX30L (Vascular Products) ×2 IMPLANT
HANDLE STAPLE ENDO GIA SHORT (STAPLE) ×1
HEMOSTAT POWDER SURGIFOAM 1G (HEMOSTASIS) ×6 IMPLANT
HEMOSTAT SURGICEL 2X14 (HEMOSTASIS) ×6 IMPLANT
INSERT FOGARTY 61MM (MISCELLANEOUS) ×2 IMPLANT
INSERT FOGARTY SM (MISCELLANEOUS) ×2 IMPLANT
INSERT FOGARTY XLG (MISCELLANEOUS) ×2 IMPLANT
KIT BASIN OR (CUSTOM PROCEDURE TRAY) ×2 IMPLANT
KIT ROOM TURNOVER OR (KITS) ×2 IMPLANT
KIT SUCTION CATH 14FR (SUCTIONS) ×8 IMPLANT
KIT TOURNIQUET VASCULAR (KITS) ×2 IMPLANT
KIT VASOVIEW HEMOPRO VH 3000 (KITS) ×2 IMPLANT
LARGE BORE CENTRAL VENOUS CATH KIT, MULTI-LUMEN ×2 IMPLANT
LEAD PACING MYOCARDI (MISCELLANEOUS) ×2 IMPLANT
LINE VENT (MISCELLANEOUS) ×2 IMPLANT
LOOP VESSEL MAXI BLUE (MISCELLANEOUS) ×2 IMPLANT
LOOP VESSEL MINI RED (MISCELLANEOUS) ×2 IMPLANT
MARKER GRAFT CORONARY BYPASS (MISCELLANEOUS) ×6 IMPLANT
NS IRRIG 1000ML POUR BTL (IV SOLUTION) ×12 IMPLANT
PACK OPEN HEART (CUSTOM PROCEDURE TRAY) ×2 IMPLANT
PAD ARMBOARD 7.5X6 YLW CONV (MISCELLANEOUS) ×4 IMPLANT
PAD ELECT DEFIB RADIOL ZOLL (MISCELLANEOUS) ×2 IMPLANT
PENCIL BUTTON HOLSTER BLD 10FT (ELECTRODE) ×2 IMPLANT
PUNCH AORTIC ROTATE 4.0MM (MISCELLANEOUS) IMPLANT
PUNCH AORTIC ROTATE 4.5MM 8IN (MISCELLANEOUS) IMPLANT
PUNCH AORTIC ROTATE 5MM 8IN (MISCELLANEOUS) IMPLANT
RELOAD ENDO GIA 30 3.5 (STAPLE) ×4 IMPLANT
RELOAD GIA II 30 3.5 (ENDOMECHANICALS) ×2 IMPLANT
SEALANT SURG COSEAL 4ML (VASCULAR PRODUCTS) ×4 IMPLANT
SEALANT SURG COSEAL 8ML (VASCULAR PRODUCTS) ×4 IMPLANT
SET CARDIOPLEGIA MPS 5001102 (MISCELLANEOUS) ×2 IMPLANT
SET VEIN GRAFT PERF (SET/KITS/TRAYS/PACK) ×2 IMPLANT
SLEEVE SURGEON STRL (DRAPES) ×2 IMPLANT
SOLUTION ANTI FOG 6CC (MISCELLANEOUS) ×2 IMPLANT
SPONGE GAUZE 4X4 12PLY STER LF (GAUZE/BANDAGES/DRESSINGS) ×2 IMPLANT
STAPLER ENDO GIA 12MM SHORT (STAPLE) ×1 IMPLANT
SUT BONE WAX W31G (SUTURE) ×2 IMPLANT
SUT MNCRL AB 4-0 PS2 18 (SUTURE) ×2 IMPLANT
SUT PROLENE 3 0 RB 1 (SUTURE) ×6 IMPLANT
SUT PROLENE 3 0 SH DA (SUTURE) ×2 IMPLANT
SUT PROLENE 3 0 SH1 36 (SUTURE) ×46 IMPLANT
SUT PROLENE 4 0 RB 1 (SUTURE) ×16
SUT PROLENE 4 0 SH DA (SUTURE) IMPLANT
SUT PROLENE 4-0 RB1 .5 CRCL 36 (SUTURE) ×16 IMPLANT
SUT PROLENE 5 0 C 1 36 (SUTURE) ×18 IMPLANT
SUT PROLENE 6 0 C 1 30 (SUTURE) ×8 IMPLANT
SUT PROLENE 6 0 CC (SUTURE) ×8 IMPLANT
SUT PROLENE 7 0 BV1 MDA (SUTURE) ×2 IMPLANT
SUT PROLENE 8 0 BV175 6 (SUTURE) IMPLANT
SUT SILK 2 0 SH (SUTURE) ×2 IMPLANT
SUT STEEL 6MS V (SUTURE) ×2 IMPLANT
SUT STEEL STERNAL CCS#1 18IN (SUTURE) IMPLANT
SUT STEEL SZ 6 DBL 3X14 BALL (SUTURE) ×2 IMPLANT
SUT VIC AB 1 CTX 36 (SUTURE) ×2
SUT VIC AB 1 CTX36XBRD ANBCTR (SUTURE) ×2 IMPLANT
SUT VIC AB 2-0 CT1 27 (SUTURE) ×1
SUT VIC AB 2-0 CT1 TAPERPNT 27 (SUTURE) ×1 IMPLANT
SUT VIC AB 2-0 CTX 27 (SUTURE) IMPLANT
SUT VIC AB 3-0 SH 27 (SUTURE)
SUT VIC AB 3-0 SH 27X BRD (SUTURE) IMPLANT
SUT VIC AB 3-0 X1 27 (SUTURE) IMPLANT
SUT VICRYL 4-0 PS2 18IN ABS (SUTURE) IMPLANT
SUTURE E-PAK OPEN HEART (SUTURE) ×2 IMPLANT
SYSTEM SAHARA CHEST DRAIN ATS (WOUND CARE) ×2 IMPLANT
TOWEL OR 17X24 6PK STRL BLUE (TOWEL DISPOSABLE) ×6 IMPLANT
TOWEL OR 17X26 10 PK STRL BLUE (TOWEL DISPOSABLE) ×6 IMPLANT
TRAY FOLEY IC TEMP SENS 16FR (CATHETERS) ×2 IMPLANT
TUBE FEEDING 8FR 16IN STR KANG (MISCELLANEOUS) ×2 IMPLANT
TUBING INSUFFLATION (TUBING) ×2 IMPLANT
UNDERPAD 30X30 (UNDERPADS AND DIAPERS) ×2 IMPLANT
VENT LEFT HEART 12002 (CATHETERS) ×2
WATER STERILE IRR 1000ML POUR (IV SOLUTION) ×4 IMPLANT
YANKAUER SUCT BULB TIP NO VENT (SUCTIONS) ×2 IMPLANT

## 2016-06-03 SURGICAL SUPPLY — 15 items
CATH EXPO 5F FL3.5 (CATHETERS) ×2 IMPLANT
CATH EXPO 5FR FR4 (CATHETERS) ×2 IMPLANT
CATH S G BIP PACING (SET/KITS/TRAYS/PACK) ×2 IMPLANT
CATH VISTA GUIDE 6FR JR4 (CATHETERS) ×2 IMPLANT
DEVICE RAD COMP TR BAND LRG (VASCULAR PRODUCTS) ×2 IMPLANT
GLIDESHEATH SLEND SS 6F .021 (SHEATH) ×2 IMPLANT
KIT HEART LEFT (KITS) ×2 IMPLANT
PACK CARDIAC CATHETERIZATION (CUSTOM PROCEDURE TRAY) ×2 IMPLANT
SET INTRODUCER MICROPUNCT 5F (INTRODUCER) ×2 IMPLANT
SHEATH PINNACLE 6F 10CM (SHEATH) ×4 IMPLANT
SLEEVE REPOSITIONING LENGTH 30 (MISCELLANEOUS) ×2 IMPLANT
TRANSDUCER W/STOPCOCK (MISCELLANEOUS) ×2 IMPLANT
TUBING CIL FLEX 10 FLL-RA (TUBING) ×2 IMPLANT
WIRE EMERALD 3MM-J .035X150CM (WIRE) ×2 IMPLANT
WIRE HI TORQ VERSACORE-J 145CM (WIRE) ×2 IMPLANT

## 2016-06-03 NOTE — Anesthesia Procedure Notes (Signed)
Procedure Name: Intubation Date/Time: 06/11/2016 4:10 PM Performed by: Eligha Bridegroom Pre-anesthesia Checklist: Patient identified, Emergency Drugs available, Suction available, Patient being monitored and Timeout performed Patient Re-evaluated:Patient Re-evaluated prior to inductionOxygen Delivery Method: Circle system utilized Preoxygenation: Pre-oxygenation with 100% oxygen Intubation Type: IV induction Laryngoscope Size: Mac and 4 Grade View: Grade III Tube type: Subglottic suction tube Tube size: 7.5 mm Number of attempts: 1 Airway Equipment and Method: Stylet Placement Confirmation: ETT inserted through vocal cords under direct vision,  positive ETCO2 and breath sounds checked- equal and bilateral Secured at: 21 cm Tube secured with: Tape Dental Injury: Teeth and Oropharynx as per pre-operative assessment

## 2016-06-03 NOTE — Progress Notes (Signed)
  Echocardiogram Echocardiogram Transesophageal has been performed.  Martha Meyer 06/22/2016, 5:30 PM

## 2016-06-03 NOTE — H&P (Signed)
 Cardiology History & Physical    Patient ID: Martha Meyer MRN: [id number], DOB: April 13, 1951 Date of Encounter: 06/28/2016, 7:13 PM Primary Physician: Viviana Simpler, MD Primary Cardiologist: None  Chief Complaint: Chest pain Reason for Admission: STEMI Requesting MD: Brantley Stage, MD  HPI: Martha Meyer is a 65-y/o woman with history of HTN, HLD, OSA, and GERD, who had acute onset of left upper chest pain radiating to the neck at ~2:30 PM while getting ready to walk her dog. She reports accompanying shortness of breath but denies nausea, vomiting, and back pain.  EMS was summoned and found the patient to be bradycardia.  Initial EKG showed complete heart block with junctional escape and marked inferior ST segment elevation.  Subsequent tracings showed restoration of sinus rhythm with normal AV conduction but persistent inferior ST elevation.  The patient received ASA and SL NTG with hypotensive response requiring fluid bolus.  Upon arrival in the ED, the patient was initially confused and unable to answer questions coherently.  However, after ~5 minutes, she became alert and oriented, complaining only of chest and left-sided neck pain.  Though no focal neuro deficits were observed in the ED, STAT head CT was performed to exclude intracranial process.  As this was negative, the patient was referred for emergent LHC.  She was found to have type A aortic dissection with severe aortic regurgitation and mildly to moderately reduced LV function with inferior hypokinesis.  The patient had recurrence of complete heart block with ventricular rate in the 30's, for which a temporary transvenous pacemaker was placed.  She also had 3 episodes of ventricular fibrillation that were successfully treated with defibrillation and IV amiodarone.  She was intubated and sedated for comfort and airway protection, given recurrent VF requiring shocks.  The patient was evaluated by Dr. Roxan Hockey and was taken for emergent aortic  repair.   Past Medical History:  Diagnosis Date  . Depression   . Diverticulosis of colon   . GERD (gastroesophageal reflux disease)   . Gout   . Hx of adenomatous colonic polyps 04/14/2016  . Hyperlipidemia   . Hypertension   . Kidney stones   . Migraines   . Obstructive sleep apnea    uses c-pap  . Osteoarthritis    in spine and hip  . Osteoporosis      Surgical History:  Past Surgical History:  Procedure Laterality Date  . CESAREAN SECTION     x 1  . ESOPHAGOGASTRODUODENOSCOPY  3/07   stricture dilated  . STONE EXTRACTION WITH BASKET  6/05   [facility]  . TOTAL HIP ARTHROPLASTY  12/10   R, Dr Maureen Ralphs  . TUBAL LIGATION  1990's  . VAGINAL DELIVERY     x 1     Home Meds: Prior to Admission medications   Medication Sig Start Date Martha Meyer Date Taking? Authorizing Provider  cholecalciferol (VITAMIN D) 1000 units tablet Take 1,000 Units by mouth daily.    Historical Provider, MD  colchicine 0.6 MG tablet Take 0.6 mg by mouth 2 (two) times daily as needed. For gout flares    Venia Carbon, MD  estradiol (ESTRACE) 0.1 MG/GM vaginal cream Place 1 Applicatorful vaginally at bedtime. Use 2-3 times per week 03/25/16   Venia Carbon, MD  hydrOXYzine (ATARAX/VISTARIL) 25 MG tablet Take 1 tablet by mouth at bedtime. 04/22/16   Historical Provider, MD  ibuprofen (ADVIL,MOTRIN) 200 MG tablet 2-3 tabs by mouth three times a day with food as needed for gout  pain     Historical Provider, MD  losartan (COZAAR) 100 MG tablet TAKE 1 TABLET BY MOUTH  DAILY 04/13/16   Venia Carbon, MD  Multiple Vitamin (MULTIVITAMIN) tablet Take 1 tablet by mouth daily.    Historical Provider, MD  triamcinolone cream (KENALOG) 0.1 % Apply 1 application topically 2 (two) times daily. 04/22/16 04/22/17  Historical Provider, MD    Allergies:  Allergies  Allergen Reactions  . Bupropion Hcl     REACTION: more talkative/ agrressive  . Oxycodone-Acetaminophen     REACTION: n/v  . Paroxetine     REACTION:  More talkative/agreesive    Social History   Social History  . Marital status: Married    Spouse name: N/A  . Number of children: 2  . Years of education: N/A   Occupational History  . 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 Smoking status: Never Smoker  . Smokeless tobacco: Never Used  . Alcohol use No  . Drug use: No  . Sexual activity: Not on file   Other Topics Concern  . Not on file   Social History Narrative  . No narrative on file     Family History  Problem Relation Age of Onset  . Hepatitis Father     Hep C  . Hypertension Father   . Heart attack Mother   . Diabetes Mother   . Coronary artery disease Mother   . Heart failure Mother   . Hypertension Mother     ?  . Coronary artery disease Paternal Grandfather   . Heart failure Paternal Grandfather   . Diabetes Maternal Grandmother   . Breast cancer Paternal Grandmother   . Colon cancer Neg Hx     Review of Systems: Unable to assess due to altered mental status and critical illness.  Labs:  Lab Results  Component Value Date   WBC 9.5 06/23/2016   HGB 16.0 (H) 06/07/2016   HCT 47.0 (H) 06/06/2016   MCV 89.0 06/01/2016   PLT 162 06/26/2016    Recent Labs Lab 06/23/2016 1517 06/20/2016 1523  NA 139 141  K 3.0* 3.1*  CL 106 105  CO2 20*  --   BUN 13 18  CREATININE 1.07* 1.00  CALCIUM 9.0  --   PROT 6.3*  --   BILITOT 1.0  --   ALKPHOS 62  --   ALT 25  --   AST 30  --   GLUCOSE 140* 141*    Recent Labs  06/11/2016 1517  TROPONINI <0.03   Lab Results  Component Value Date   CHOL 235 (H) 06/02/2016   HDL 51 06/10/2016   LDLCALC 137 (H) 06/19/2016   TRIG 233 (H) 06/22/2016   No results found for: DDIMER  Radiology/Studies:  Ct Head Wo Contrast  Result Date: 06/23/2016 CLINICAL DATA:  Confusion. EXAM: CT HEAD WITHOUT CONTRAST TECHNIQUE: Contiguous axial images were obtained from the base of the skull through the  vertex without intravenous contrast. COMPARISON:  None. FINDINGS: Brain: Mild chronic ischemic white matter disease is noted. No mass effect or midline shift is noted. Ventricular size is within normal limits. There is no evidence of mass lesion, hemorrhage or acute infarction. Vascular: No hyperdense vessel or unexpected calcification. Skull: Normal. Negative for fracture or focal lesion. Sinuses/Orbits: No acute finding. Other: None. IMPRESSION: Mild chronic ischemic white matter disease. No acute intracranial abnormality seen. Electronically Signed  By: Marijo Conception, M.D.   On: 06/30/2016 15:32   Wt Readings from Last 3 Encounters:  06/29/2016 81.6 kg (180 lb)  04/29/16 88.5 kg (195 lb)  04/09/16 82.1 kg (181 lb)    EKG: NSR with inferior ST segment elevation and marked anterolateral ST depressions.  Physical Exam: Pulse Rate:  [0-100] 0 (01/03 1645) Resp:  [0-58] 0 (01/03 1645) BP: (75-142)/(44-74) 75/44 (01/03 1640) SpO2:  [0 %-100 %] 0 % (01/03 1645) FiO2 (%):  [100 %] 100 % (01/03 1635) Weight:  [81.6 kg (180 lb)] 81.6 kg (180 lb) (01/03 1524) General: Overweight woman, intubated on cath table Head: Normocephalic, atraumatic, sclera non-icteric, no xanthomas, nares are without discharge.  Neck: Negative for carotid bruits. JVD not elevated. Lungs: Clear anteriorly Heart: Distant heart sounds.  RRR without murmurs. Abdomen: Soft, mild left upper quadrant tenderness; non-distended with normoactive bowel sounds. No hepatomegaly. No rebound/guarding. No obvious abdominal masses. Msk:  Strength and tone appear normal for age. Extremities: No clubbing or cyanosis. No edema.  Radial and femoral pulses are 2+ bilaterally precath.  Right radial artery, and right femoral arterial/venous sheath in place post-cath (including temporary transvenous pacemaker). Neuro: Alert and oriented X 3. No focal deficit. No facial asymmetry. Moves all extremities spontaneously. Psych:  Responds to questions  appropriately with a normal affect.    Assessment and Plan  66 y/o woman with history of HTN, HLD, OSA, and GERD, presenting with inferior STEMI and found to have type A aortic dissection.  Inferior STEMI: Likely due to involvement of RCA by aortic dissection.  Artery could not be engaged by catheterization, though faint contrast filling during root shot is noted.  Sent for emergent cardiac surgery; further management per TCTS.  Right radial artery, right femoral artery, and right femoral vein sheaths remain in place; cath lab should be contacted for sheath removal when patient reaches the ICU following surgery.  Aortic dissection: Spontaneous type A dissection was likely precipitant of patient's chest pain and transient altered mental status.  She is currently in the OR undergoing root repair.  Further management per TCTS.  Complete heart block: Likely due to RCA ischemia.  Temp wire in place.  Dispo:  Transfer to SICU on TCTS service following OR.  Verlan Friends Lewi Drost MD , 7:13 PM Pager: [phone]

## 2016-06-03 NOTE — Anesthesia Preprocedure Evaluation (Addendum)
Anesthesia Evaluation  Patient identified by MRN, date of birth, ID bandGeneral Assessment Comment:Urgent to OR after Aortic dissection , intubated in CAth lab  Reviewed: NPO status , Unable to perform ROS - Chart review only  Airway Mallampati: IV       Dental  (+) Teeth Intact   Pulmonary     + decreased breath sounds      Cardiovascular hypertension,  Rhythm:Regular Rate:Tachycardia  Aortic dissection, CAD   Neuro/Psych    GI/Hepatic   Endo/Other    Renal/GU      Musculoskeletal   Abdominal (+) + obese,   Peds  Hematology   Anesthesia Other Findings   Reproductive/Obstetrics                            Anesthesia Physical Anesthesia Plan  ASA: IV and emergent  Anesthesia Plan: General   Post-op Pain Management:    Induction:   Airway Management Planned: Oral ETT  Additional Equipment: TEE, Arterial line, PA Cath and Ultrasound Guidance Line Placement  Intra-op Plan:   Post-operative Plan: Post-operative intubation/ventilation  Informed Consent: I have reviewed the patients History and Physical, chart, labs and discussed the procedure including the risks, benefits and alternatives for the proposed anesthesia with the patient or authorized representative who has indicated his/her understanding and acceptance.     Plan Discussed with:   Anesthesia Plan Comments:         Anesthesia Quick Evaluation

## 2016-06-03 NOTE — Brief Op Note (Addendum)
06/12/2016  11:48 PM  PATIENT:  Martha Meyer  66 y.o. female  PRE-OPERATIVE DIAGNOSIS: Type I  Aortic Dissection  POST-OPERATIVE DIAGNOSIS:  Type I  Aortic Dissection  PROCEDURE: Emergent median sternotomy for repair of acute type 1 aortic dissection with reconstruction of the aortic arch under deep hypothermic circulatory arrest with antegrade cerebral perfusion via right axillary cannulation, CABG x 1 (SVG to RCA) using right thigh greater saphenous vein  SURGEON:  Surgeon(s) and Role:    * Grace Isaac, MD - Primary  PHYSICIAN ASSISTANT: Lars Pinks PA-C  ANESTHESIA:   general  EBL:  Total I/O In: -  Out: 200 [Urine:200]  BLOOD ADMINISTERED: Multiple PRBCs, cell saver, FFP, platelets, and cryo  DRAINS: Chest tubes placed in the mediastinal and pleural spaces   SPECIMEN:  Source of Specimen:  Portion of aortic dissection  DISPOSITION OF SPECIMEN:  PATHOLOGY  COUNTS CORRECT:  YES  DICTATION: .Dragon Dictation  PLAN OF CARE: Admit to inpatient   PATIENT DISPOSITION:  ICU - intubated and critically ill.   Delay start of Pharmacological VTE agent (>24hrs) due to surgical blood loss or risk of bleeding: yes  BASELINE WEIGHT: 81.6 kg

## 2016-06-03 NOTE — Progress Notes (Signed)
Called stat via Cath Lab for vent and intubation. Pt was intubated and placed on vent briefly then transported to OR with OR team via manual ventilation by CRNA.

## 2016-06-03 NOTE — Consult Note (Signed)
Reason for Consult:Aortic dissection Referring Physician: Dr. Jonny Ruiz is an 66 y.o. female.  HPI: 67 yo woman with history of hypertension, hyperlipidemia, obesity, sleep apnea and osteoporosis. She presented to the ED this afternoon with cc/o CP. ECG showed inferior ST elevation. She began "speaking gibberish'" so was taken for a head CT which was negative for an acute stroke. She was taken to the cath lab as a code STEMI. Catheterization revealed an aortic dissection with severe AI and total disruption of the RCA. Left coronary circulation was OK.  Dr. Servando Snare who is on call was finishing a case in OR, so I came to the cath lab.  She was on the cath table and initially alert and cooperative. She understood she needed emergency surgery. While I was talking to her she had VF. Shocked x 2 with return of rhythm. While I was speaking with family she required 3 additional shocks.  I informed her family of the need for emergency surgery to repair the type I aortic dissection with repair or replacement of the aortic valve and possibly CABG to RCA.   OR was notified immediately and Anesthesia came to cath lab to intubate the patient and transport her to the OR.  Past Medical History:  Diagnosis Date  . Depression   . Diverticulosis of colon   . GERD (gastroesophageal reflux disease)   . Gout   . Hx of adenomatous colonic polyps 04/14/2016  . Hyperlipidemia   . Hypertension   . Kidney stones   . Migraines   . Obstructive sleep apnea    uses c-pap  . Osteoarthritis    in spine and hip  . Osteoporosis     Past Surgical History:  Procedure Laterality Date  . CESAREAN SECTION     x 1  . ESOPHAGOGASTRODUODENOSCOPY  3/07   stricture dilated  . STONE EXTRACTION WITH BASKET  6/05   [facility]  . TOTAL HIP ARTHROPLASTY  12/10   R, Dr Maureen Ralphs  . TUBAL LIGATION  1990's  . VAGINAL DELIVERY     x 1    Family History  Problem Relation Age of Onset  . Hepatitis Father     Hep C   . Hypertension Father   . Heart attack Mother   . Diabetes Mother   . Coronary artery disease Mother   . Heart failure Mother   . Hypertension Mother     ?  . Coronary artery disease Paternal Grandfather   . Heart failure Paternal Grandfather   . Diabetes Maternal Grandmother   . Breast cancer Paternal Grandmother   . Colon cancer Neg Hx     Social History:  reports that she has never smoked. She has never used smokeless tobacco. She reports that she does not drink alcohol or use drugs.  Allergies:  Allergies  Allergen Reactions  . Bupropion Hcl     REACTION: more talkative/ agrressive  . Oxycodone-Acetaminophen     REACTION: n/v  . Paroxetine     REACTION: More talkative/agreesive    Medications:  Prior to Admission:  Facility-Administered Medications Prior to Admission  Medication Dose Route Frequency Provider Last Rate Last Dose  . 0.9 %  sodium chloride infusion  500 mL Intravenous Continuous Gatha Mayer, MD       Prescriptions Prior to Admission  Medication Sig Dispense Refill Last Dose  . cholecalciferol (VITAMIN D) 1000 units tablet Take 1,000 Units by mouth daily.   Taking  . colchicine 0.6 MG  tablet Take 0.6 mg by mouth 2 (two) times daily as needed. For gout flares   Taking  . estradiol (ESTRACE) 0.1 MG/GM vaginal cream Place 1 Applicatorful vaginally at bedtime. Use 2-3 times per week 42.5 g 12 Taking  . hydrOXYzine (ATARAX/VISTARIL) 25 MG tablet Take 1 tablet by mouth at bedtime.   Taking  . ibuprofen (ADVIL,MOTRIN) 200 MG tablet 2-3 tabs by mouth three times a day with food as needed for gout pain    Taking  . losartan (COZAAR) 100 MG tablet TAKE 1 TABLET BY MOUTH  DAILY 90 tablet 3 Taking  . Multiple Vitamin (MULTIVITAMIN) tablet Take 1 tablet by mouth daily.   Taking  . triamcinolone cream (KENALOG) 0.1 % Apply 1 application topically 2 (two) times daily.   Taking    Results for orders placed or performed during the hospital encounter of 06/02/2016 (from  the past 48 hour(s))  CBC     Status: Abnormal   Collection Time: 06/15/2016  3:17 PM  Result Value Ref Range   WBC 9.5 4.0 - 10.5 K/uL   RBC 5.28 (H) 3.87 - 5.11 MIL/uL   Hemoglobin 15.9 (H) 12.0 - 15.0 g/dL   HCT 47.0 (H) 36.0 - 46.0 %   MCV 89.0 78.0 - 100.0 fL   MCH 30.1 26.0 - 34.0 pg   MCHC 33.8 30.0 - 36.0 g/dL   RDW 14.2 11.5 - 15.5 %   Platelets 162 150 - 400 K/uL  Differential     Status: None   Collection Time: 06/07/2016  3:17 PM  Result Value Ref Range   Neutrophils Relative % 54 %   Neutro Abs 5.1 1.7 - 7.7 K/uL   Lymphocytes Relative 39 %   Lymphs Abs 3.7 0.7 - 4.0 K/uL   Monocytes Relative 6 %   Monocytes Absolute 0.5 0.1 - 1.0 K/uL   Eosinophils Relative 1 %   Eosinophils Absolute 0.1 0.0 - 0.7 K/uL   Basophils Relative 0 %   Basophils Absolute 0.0 0.0 - 0.1 K/uL  Protime-INR     Status: None   Collection Time: 06/26/2016  3:17 PM  Result Value Ref Range   Prothrombin Time 13.5 11.4 - 15.2 seconds   INR 1.03   APTT     Status: None   Collection Time: 06/01/2016  3:17 PM  Result Value Ref Range   aPTT 35 24 - 36 seconds  Comprehensive metabolic panel     Status: Abnormal   Collection Time: 06/29/2016  3:17 PM  Result Value Ref Range   Sodium 139 135 - 145 mmol/L   Potassium 3.0 (L) 3.5 - 5.1 mmol/L   Chloride 106 101 - 111 mmol/L   CO2 20 (L) 22 - 32 mmol/L   Glucose, Bld 140 (H) 65 - 99 mg/dL   BUN 13 6 - 20 mg/dL   Creatinine, Ser 1.07 (H) 0.44 - 1.00 mg/dL   Calcium 9.0 8.9 - 10.3 mg/dL   Total Protein 6.3 (L) 6.5 - 8.1 g/dL   Albumin 3.6 3.5 - 5.0 g/dL   AST 30 15 - 41 U/L   ALT 25 14 - 54 U/L   Alkaline Phosphatase 62 38 - 126 U/L   Total Bilirubin 1.0 0.3 - 1.2 mg/dL   GFR calc non Af Amer 54 (L) >60 mL/min   GFR calc Af Amer >60 >60 mL/min    Comment: (NOTE) The eGFR has been calculated using the CKD EPI equation. This calculation has not been validated in  all clinical situations. eGFR's persistently <60 mL/min signify possible Chronic  Kidney Disease.    Anion gap 13 5 - 15  Troponin I     Status: None   Collection Time: 06/13/2016  3:17 PM  Result Value Ref Range   Troponin I <0.03 <0.03 ng/mL  Lipid panel     Status: Abnormal   Collection Time: 06/29/2016  3:17 PM  Result Value Ref Range   Cholesterol 235 (H) 0 - 200 mg/dL   Triglycerides 233 (H) <150 mg/dL   HDL 51 >40 mg/dL   Total CHOL/HDL Ratio 4.6 RATIO   VLDL 47 (H) 0 - 40 mg/dL   LDL Cholesterol 137 (H) 0 - 99 mg/dL    Comment:        Total Cholesterol/HDL:CHD Risk Coronary Heart Disease Risk Table                     Men   Women  1/2 Average Risk   3.4   3.3  Average Risk       5.0   4.4  2 X Average Risk   9.6   7.1  3 X Average Risk  23.4   11.0        Use the calculated Patient Ratio above and the CHD Risk Table to determine the patient's CHD Risk.        ATP III CLASSIFICATION (LDL):  <100     mg/dL   Optimal  100-129  mg/dL   Near or Above                    Optimal  130-159  mg/dL   Borderline  160-189  mg/dL   High  >190     mg/dL   Very High   POCT i-Stat troponin I     Status: None   Collection Time: 06/01/2016  3:21 PM  Result Value Ref Range   Troponin i, poc 0.01 0.00 - 0.08 ng/mL   Comment 3            Comment: Due to the release kinetics of cTnI, a negative result within the first hours of the onset of symptoms does not rule out myocardial infarction with certainty. If myocardial infarction is still suspected, repeat the test at appropriate intervals.   I-Stat Chem 8, ED     Status: Abnormal   Collection Time: 06/11/2016  3:23 PM  Result Value Ref Range   Sodium 141 135 - 145 mmol/L   Potassium 3.1 (L) 3.5 - 5.1 mmol/L   Chloride 105 101 - 111 mmol/L   BUN 18 6 - 20 mg/dL   Creatinine, Ser 1.00 0.44 - 1.00 mg/dL   Glucose, Bld 141 (H) 65 - 99 mg/dL   Calcium, Ion 1.04 (L) 1.15 - 1.40 mmol/L   TCO2 25 0 - 100 mmol/L   Hemoglobin 16.0 (H) 12.0 - 15.0 g/dL   HCT 47.0 (H) 36.0 - 46.0 %  POCT Activated clotting time      Status: None   Collection Time: 06/02/2016  4:08 PM  Result Value Ref Range   Activated Clotting Time 208 seconds  Type and screen [place]     Status: None (Preliminary result)   Collection Time: 06/23/2016  4:11 PM  Result Value Ref Range   ABO/RH(D) A POS    Antibody Screen NEG    Sample Expiration 06/06/2016    Unit Number [id number]    Blood Component Type  RED CELLS,LR    Unit division 00    Status of Unit ISSUED    Transfusion Status OK TO TRANSFUSE    Crossmatch Result Compatible    Unit Number [id number]    Blood Component Type RED CELLS,LR    Unit division 00    Status of Unit ISSUED    Transfusion Status OK TO TRANSFUSE    Crossmatch Result Compatible    Unit Number [id number]    Blood Component Type RED CELLS,LR    Unit division 00    Status of Unit ISSUED    Transfusion Status OK TO TRANSFUSE    Crossmatch Result Compatible    Unit Number [id number]    Blood Component Type RED CELLS,LR    Unit division 00    Status of Unit ISSUED    Transfusion Status OK TO TRANSFUSE    Crossmatch Result Compatible   ABO/Rh     Status: None   Collection Time: 06/12/2016  4:11 PM  Result Value Ref Range   ABO/RH(D) A POS   Prepare RBC (crossmatch)     Status: None   Collection Time: 06/28/2016  4:31 PM  Result Value Ref Range   Order Confirmation ORDER PROCESSED BY BLOOD BANK   Prepare Pheresed Platelets     Status: None (Preliminary result)   Collection Time: 06/05/2016  4:46 PM  Result Value Ref Range   Blood Product Unit Number [id number]    Unit Type and Rh 9628    Blood Product Expiration Date 366294765465    Blood Product Unit Number [id number]    Unit Type and Rh 7300    Blood Product Expiration Date 700174944967     Ct Head Wo Contrast  Result Date: 06/28/2016 CLINICAL DATA:  Confusion. EXAM: CT HEAD WITHOUT CONTRAST TECHNIQUE: Contiguous axial images were obtained from the base of the skull through the vertex without intravenous  contrast. COMPARISON:  None. FINDINGS: Brain: Mild chronic ischemic white matter disease is noted. No mass effect or midline shift is noted. Ventricular size is within normal limits. There is no evidence of mass lesion, hemorrhage or acute infarction. Vascular: No hyperdense vessel or unexpected calcification. Skull: Normal. Negative for fracture or focal lesion. Sinuses/Orbits: No acute finding. Other: None. IMPRESSION: Mild chronic ischemic white matter disease. No acute intracranial abnormality seen. Electronically Signed   By: Marijo Conception, M.D.   On: 06/26/2016 15:32    ROS Blood pressure (!) 75/44, pulse (!) 0, resp. rate (!) 0, height 5' 4"  (1.626 m), weight 180 lb (81.6 kg), SpO2 (!) 0 %. Physical Exam  Vitals reviewed. Constitutional:  obese  HENT:  Head: Normocephalic and atraumatic.  Cardiovascular: Intact distal pulses.   Murmur heard. Respiratory:  Clear initially  Neurological:  Alert initially, confused after shocks prior to intubation   CATH films reviewed  Assessment/Plan: 66 yo woman who presented as an inferior STEMI and was found to have a type I aortic dissection at cath with occlusion of the RCA and severe AI. She has had 2 episodes of VF in cath lab requiring a total of 5 shocks. She needs emergent repair of her dissection as her only hope of survival.  I informed the family of the diagnosis and need for surgery. They understand this is a high risk undertaking with no more than a 50/50 chance of survival. They understand need for transfusions and possibility of major complications such as stroke, infection, renal or respiratory failure and GI complications.   Her husband consents to  proceed  Melrose Nakayama 06/19/2016, 5:53 PM

## 2016-06-03 NOTE — Progress Notes (Signed)
   06/28/2016 1700  Clinical Encounter Type  Visited With Family  Visit Type Initial;Patient in surgery;Spiritual support  Referral From Nurse  Spiritual Encounters  Spiritual Needs Prayer;Emotional  [place] contacted to offer support to family in waiting area outside surgery; pt in surgery; family - spouse, 2 daughters and son-in law, are all waiting; [place] offered emotional and spiritual support along with prayer; [facility] available for additional support - page [phone].  5:18 PM Gwynn Burly

## 2016-06-03 NOTE — ED Notes (Signed)
Primary nurse transported patient on [place] and cardiac monitor to CT and then cath lab.

## 2016-06-03 NOTE — Brief Op Note (Signed)
Brief Cardiac Catheterization Note (Full Report to Follow)  Date: 07/01/2016 Time: 5:13 PM  PATIENT:  Martha Meyer  66 y.o. 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 Type A aortic dissection with likely occlusion of ostial RCA. 2. Severe aortic regurgitation. 3. Mild, non-obstructive CAD involving LCA. 4. Mild to moderately reduced LV contraction with inferior hypokinesis. 5. Complete heart block with successful placement of temporary transvenous pacemaker. 6. Limited bedside echo without significant pericardial effusion.  RECOMMENDATIONS: 1.  Patient transferred to OR for emergent aortic root repair.  Further management per TCTS.  Nelva Bush, MD [facility] Pager: [phone]

## 2016-06-03 NOTE — ED Provider Notes (Signed)
[facility] DEPT Provider Note   CSN: [id number] Arrival date & time: 06/26/2016  1512     History   Chief Complaint Chief Complaint  Patient presents with  . Code STEMI  . Altered Mental Status    HPI Martha Meyer is a 66 y.o. female.  The history is provided by the patient and the EMS personnel. The history is limited by the condition of the patient.  Chest Pain   This is a new problem. The current episode started 1 to 2 hours ago. The problem occurs constantly. The problem has not changed since onset.The pain is associated with rest. The pain is present in the substernal region. Associated symptoms include abdominal pain (RUQ pain).     66 year old female with a past medical history of gout, hypertension, migraines, hyperlipidemia who presents today with concern for STEMI. Patient states she had just walked in with groceries when she began to have chest pain. Denies any prior similar symptoms to this. Patient also had lightheadedness associated with this as well. EMS relates that when they arrived the patient was bradycardic and also altered. She appeared ill, diaphoretic, pale. They noted that the patient had ST elevations in the inferior leads with reciprocal changes on her EKG. Patient was given aspirin and nitroglycerin and brought to our facility for further management.  Patient denies any prior myocardial infarctions in the past. Denies any recent fever, chills, nausea, vomiting, diarrhea. No clear worsening or alleviating factors. The history is limited due to the acuity of the condition. Cardiology is at the bedside on patient's arrival.  Past Medical History:  Diagnosis Date  . Depression   . Diverticulosis of colon   . GERD (gastroesophageal reflux disease)   . Gout   . Hx of adenomatous colonic polyps 04/14/2016  . Hyperlipidemia   . Hypertension   . Kidney stones   . Migraines   . Obstructive sleep apnea    uses c-pap  . Osteoarthritis    in spine and hip    . Osteoporosis     Patient Active Problem List   Diagnosis Date Noted  . Hx of adenomatous colonic polyps 04/14/2016  . Atrophic vaginitis 12/04/2013  . Hyperlipidemia   . Routine general medical examination at a health care facility 09/10/2011  . HEARING LOSS 06/09/2010  . Gout 01/07/2010  . Obstructive sleep apnea 04/21/2007  . Essential hypertension, benign 04/21/2007  . RENAL CALCULUS 04/21/2007  . OSTEOPOROSIS 04/21/2007    Past Surgical History:  Procedure Laterality Date  . CESAREAN SECTION     x 1  . ESOPHAGOGASTRODUODENOSCOPY  3/07   stricture dilated  . STONE EXTRACTION WITH BASKET  6/05   [facility]  . TOTAL HIP ARTHROPLASTY  12/10   R, Dr Maureen Ralphs  . TUBAL LIGATION  1990's  . VAGINAL DELIVERY     x 1    OB History    No data available       Home Medications    Prior to Admission medications   Medication Sig Start Date End Date Taking? Authorizing Provider  cholecalciferol (VITAMIN D) 1000 units tablet Take 1,000 Units by mouth daily.    Historical Provider, MD  colchicine 0.6 MG tablet Take 0.6 mg by mouth 2 (two) times daily as needed. For gout flares    Venia Carbon, MD  estradiol (ESTRACE) 0.1 MG/GM vaginal cream Place 1 Applicatorful vaginally at bedtime. Use 2-3 times per week 03/25/16   Venia Carbon, MD  hydrOXYzine (ATARAX/VISTARIL) 25  MG tablet Take 1 tablet by mouth at bedtime. 04/22/16   Historical Provider, MD  ibuprofen (ADVIL,MOTRIN) 200 MG tablet 2-3 tabs by mouth three times a day with food as needed for gout pain     Historical Provider, MD  losartan (COZAAR) 100 MG tablet TAKE 1 TABLET BY MOUTH  DAILY 04/13/16   Venia Carbon, MD  Multiple Vitamin (MULTIVITAMIN) tablet Take 1 tablet by mouth daily.    Historical Provider, MD  triamcinolone cream (KENALOG) 0.1 % Apply 1 application topically 2 (two) times daily. 04/22/16 04/22/17  Historical Provider, MD    Family History Family History  Problem Relation Age of Onset  .  Hepatitis Father     Hep C  . Hypertension Father   . Heart attack Mother   . Diabetes Mother   . Coronary artery disease Mother   . Heart failure Mother   . Hypertension Mother     ?  . Coronary artery disease Paternal Grandfather   . Heart failure Paternal Grandfather   . Diabetes Maternal Grandmother   . Breast cancer Paternal Grandmother   . Colon cancer Neg Hx     Social History Social History  Substance Use Topics  . Smoking status: Never Smoker  . Smokeless tobacco: Never Used  . Alcohol use No     Allergies   Bupropion hcl; Oxycodone-acetaminophen; and Paroxetine   Review of Systems Review of Systems  Unable to perform ROS: Acuity of condition  Cardiovascular: Positive for chest pain.  Gastrointestinal: Positive for abdominal pain (RUQ pain).  Skin: Positive for pallor.     Physical Exam Updated Vital Signs Ht 5\' 4"  (1.626 m)   Wt 81.6 kg   BMI 30.90 kg/m   Physical Exam  Constitutional: She appears ill. No distress.  HENT:  Head: Normocephalic and atraumatic.  Eyes: Conjunctivae and EOM are normal. Pupils are equal, round, and reactive to light.  Neck: Normal range of motion. Neck supple.  Cardiovascular: Normal rate and regular rhythm.   Pulmonary/Chest: Effort normal and breath sounds normal. No respiratory distress.  Abdominal: Soft. She exhibits no distension. There is no tenderness.  Musculoskeletal: Normal range of motion. She exhibits no edema.  Neurological: She is alert.  Skin: Skin is warm and dry. She is not diaphoretic. There is pallor.  Nursing note and vitals reviewed.    ED Treatments / Results  Labs (all labs ordered are listed, but only abnormal results are displayed) Labs Reviewed  I-STAT CHEM 8, ED - Abnormal; Notable for the following:       Result Value   Potassium 3.1 (*)    Glucose, Bld 141 (*)    Calcium, Ion 1.04 (*)    Hemoglobin 16.0 (*)    HCT 47.0 (*)    All other components within normal limits  CBC    DIFFERENTIAL  PROTIME-INR  APTT  COMPREHENSIVE METABOLIC PANEL  TROPONIN I  LIPID PANEL  I-STAT TROPOININ, ED    EKG  EKG Interpretation None       Radiology No results found.  Procedures Procedures (including critical care time)  Medications Ordered in ED Medications - No data to display   Initial Impression / Assessment and Plan / ED Course  I have reviewed the triage vital signs and the nursing notes.  Pertinent labs & imaging results that were available during my care of the patient were reviewed by me and considered in my medical decision making (see chart for details).  Clinical Course  Patient is a 66 year old female who presents today with findings concerning for inferior STEMI. On arrival the patient is very confused, cardiology is concerning for superimposed neurological condition that could be affecting the patient's presentation. She has no focal neurological deficits, but given that she will require anticoagulation we will scan the patient's head to assure that there is no intracranial bleeding going on. Patient's mental status did notably improve over the following minutes after arrival.  Reviewed EKG and she demonstrates clear ST elevation in the inferior leads with reciprocal changes in the lateral leads. Blood pressures within normal limits. She continues to appear ill appearing.  Has already received ASA in the field. Following head CT, team discussed with cardiology who did not see any blood on preliminary read.   Patient will be taken emergently to the cath lab. She was stable at the time of transfer.   Final Clinical Impressions(s) / ED Diagnoses   Final diagnoses:  None    New Prescriptions New Prescriptions   No medications on file     Theodosia Quay, MD 06/06/2016 [place] Liu, MD June 16, 2016 [phone]

## 2016-06-03 NOTE — Anesthesia Procedure Notes (Addendum)
Central Venous Catheter Insertion Performed by: Canary Fister, Contrina Start/End01/03/2017 5:00 PM, 06/23/2016 5:12 PM Patient location: OR. Preanesthetic checklist: patient identified, IV checked, site marked, risks and benefits discussed, monitors and equipment checked, pre-op evaluation and timeout performed Position: Trendelenburg Hand hygiene performed , maximum sterile barriers used  and Seldinger technique used Catheter size: 9.5 Fr Central line and PA cath was placed.MAC introducer Swan type:thermodilution Procedure performed using ultrasound guided technique. Ultrasound Notes:anatomy identified, needle tip was noted to be adjacent to the nerve/plexus identified and no ultrasound evidence of intravascular and/or intraneural injection Attempts: 1 Following insertion, line sutured and dressing applied. Post procedure assessment: blood return through all ports and no air  Post procedure complications: local hematoma.

## 2016-06-03 NOTE — ED Provider Notes (Signed)
I saw and evaluated the patient, reviewed the resident's note and I agree with the findings and plan.   EKG Interpretation  Date/Time:  Wednesday June 03 2016 15:13:06 EST Ventricular Rate:  87 PR Interval:    QRS Duration: 100 QT Interval:  401 QTC Calculation: 483 R Axis:   18 Text Interpretation:  Sinus rhythm Inferior infarct, acute (RCA) Probable RV involvement, suggest recording right precordial leads ** ACUTE MI / STEMI * Confirmed by LIU MD, DANA [id number]) on 06/01/2016 3:32:63 PM      66 year old female who presents with chest pain. History of OSA. In usual state of health and going up her stairs at 2:30 PM when she developed severe chest pain and shortness of breath. EMS called and on their arrival was noted to have inferior STEMI on EKG and code STEMI was activated. Receive 1 sublingual nitroglycerin and 4 baby aspirins, in route was hypotensive requiring IV fluids.  Patient on initial arrival did seem confused and disoriented, which prompted delayed to catheterization. She was evaluated with Dr. Saunders Revel from cardiology. On my evaluation, patient alert, oriented, answering questions appropriately although is a poor historian. Grossly neurologically intact. Pulses equal and symmetric throughout. Cardiopulmonary exam abdominal exam benign. Repeat EKG shows persistent ST elevation MI involving the inferior wall with likely posterior extension. Brief episode confusion felt likely related to hypotension from nitroglycerin. Hemodynamically stable in the ED. Taken for STAT CT head on discussion with Dr. Saunders Revel. CT visualized and reviewed with Dr. Nyoka Cowden from cardiology. No ICH. Taken straight to cath lab from CT.    CRITICAL CARE Performed by: Forde Dandy   Total critical care time: 35 minutes  Critical care time was exclusive of separately billable procedures and treating other patients.  Critical care was necessary to treat or prevent imminent or life-threatening deterioration.  Critical  care was time spent personally by me on the following activities: development of treatment plan with patient and/or surrogate as well as nursing, discussions with consultants, evaluation of patient's response to treatment, examination of patient, obtaining history from patient or surrogate, ordering and performing treatments and interventions, ordering and review of laboratory studies, ordering and review of radiographic studies, pulse oximetry and re-evaluation of patient's condition.    Forde Dandy, MD 06/12/2016 1535

## 2016-06-04 ENCOUNTER — Encounter (HOSPITAL_COMMUNITY): Payer: Self-pay | Admitting: Internal Medicine

## 2016-06-04 LAB — POCT I-STAT, CHEM 8
BUN: 16 mg/dL (ref 6–20)
BUN: 16 mg/dL (ref 6–20)
BUN: 16 mg/dL (ref 6–20)
BUN: 17 mg/dL (ref 6–20)
BUN: 17 mg/dL (ref 6–20)
BUN: 19 mg/dL (ref 6–20)
CALCIUM ION: 0.69 mmol/L — AB (ref 1.15–1.40)
CALCIUM ION: 0.78 mmol/L — AB (ref 1.15–1.40)
CALCIUM ION: 0.86 mmol/L — AB (ref 1.15–1.40)
CALCIUM ION: 1.14 mmol/L — AB (ref 1.15–1.40)
CHLORIDE: 101 mmol/L (ref 101–111)
CHLORIDE: 95 mmol/L — AB (ref 101–111)
CREATININE: 0.8 mg/dL (ref 0.44–1.00)
CREATININE: 0.8 mg/dL (ref 0.44–1.00)
CREATININE: 1.1 mg/dL — AB (ref 0.44–1.00)
Calcium, Ion: 1.14 mmol/L — ABNORMAL LOW (ref 1.15–1.40)
Calcium, Ion: 0.68 mmol/L — CL (ref 1.15–1.40)
Chloride: 109 mmol/L (ref 101–111)
Chloride: 106 mmol/L (ref 101–111)
Chloride: 94 mmol/L — ABNORMAL LOW (ref 101–111)
Chloride: 97 mmol/L — ABNORMAL LOW (ref 101–111)
Creatinine, Ser: 0.7 mg/dL (ref 0.44–1.00)
Creatinine, Ser: 0.8 mg/dL (ref 0.44–1.00)
Creatinine, Ser: 0.9 mg/dL (ref 0.44–1.00)
GLUCOSE: 228 mg/dL — AB (ref 65–99)
GLUCOSE: 242 mg/dL — AB (ref 65–99)
GLUCOSE: 250 mg/dL — AB (ref 65–99)
Glucose, Bld: 301 mg/dL — ABNORMAL HIGH (ref 65–99)
Glucose, Bld: 230 mg/dL — ABNORMAL HIGH (ref 65–99)
Glucose, Bld: 261 mg/dL — ABNORMAL HIGH (ref 65–99)
HCT: 19 % — ABNORMAL LOW (ref 36.0–46.0)
HCT: 21 % — ABNORMAL LOW (ref 36.0–46.0)
HCT: 44 % (ref 36.0–46.0)
HEMATOCRIT: 39 % (ref 36.0–46.0)
HEMATOCRIT: 17 % — AB (ref 36.0–46.0)
HEMATOCRIT: 22 % — AB (ref 36.0–46.0)
HEMOGLOBIN: 13.3 g/dL (ref 12.0–15.0)
HEMOGLOBIN: 7.5 g/dL — AB (ref 12.0–15.0)
Hemoglobin: 15 g/dL (ref 12.0–15.0)
Hemoglobin: 5.8 g/dL — CL (ref 12.0–15.0)
Hemoglobin: 6.5 g/dL — CL (ref 12.0–15.0)
Hemoglobin: 7.1 g/dL — ABNORMAL LOW (ref 12.0–15.0)
POTASSIUM: 3.6 mmol/L (ref 3.5–5.1)
POTASSIUM: 5.5 mmol/L — AB (ref 3.5–5.1)
Potassium: 3.7 mmol/L (ref 3.5–5.1)
Potassium: 4 mmol/L (ref 3.5–5.1)
Potassium: 4.6 mmol/L (ref 3.5–5.1)
Potassium: 5.4 mmol/L — ABNORMAL HIGH (ref 3.5–5.1)
SODIUM: 135 mmol/L (ref 135–145)
SODIUM: 139 mmol/L (ref 135–145)
SODIUM: 140 mmol/L (ref 135–145)
SODIUM: 141 mmol/L (ref 135–145)
SODIUM: 142 mmol/L (ref 135–145)
Sodium: 146 mmol/L — ABNORMAL HIGH (ref 135–145)
TCO2: 17 mmol/L (ref 0–100)
TCO2: 17 mmol/L (ref 0–100)
TCO2: 19 mmol/L (ref 0–100)
TCO2: 20 mmol/L (ref 0–100)
TCO2: 20 mmol/L (ref 0–100)
TCO2: 22 mmol/L (ref 0–100)

## 2016-06-04 LAB — PREPARE PLATELET PHERESIS
UNIT DIVISION: 0
UNIT DIVISION: 0
Unit division: 0

## 2016-06-04 LAB — TYPE AND SCREEN
ABO/RH(D): A POS
ANTIBODY SCREEN: NEGATIVE
UNIT DIVISION: 0
UNIT DIVISION: 0
UNIT DIVISION: 0
UNIT DIVISION: 0
UNIT DIVISION: 0
Unit division: 0
Unit division: 0
Unit division: 0
Unit division: 0
Unit division: 0

## 2016-06-04 LAB — POCT I-STAT 3, ART BLOOD GAS (G3+)
ACID-BASE DEFICIT: 13 mmol/L — AB (ref 0.0–2.0)
ACID-BASE DEFICIT: 15 mmol/L — AB (ref 0.0–2.0)
ACID-BASE DEFICIT: 5 mmol/L — AB (ref 0.0–2.0)
ACID-BASE DEFICIT: 8 mmol/L — AB (ref 0.0–2.0)
Acid-Base Excess: 1 mmol/L (ref 0.0–2.0)
Acid-base deficit: 7 mmol/L — ABNORMAL HIGH (ref 0.0–2.0)
BICARBONATE: 27.4 mmol/L (ref 20.0–28.0)
Bicarbonate: 12.3 mmol/L — ABNORMAL LOW (ref 20.0–28.0)
Bicarbonate: 15.2 mmol/L — ABNORMAL LOW (ref 20.0–28.0)
Bicarbonate: 16.2 mmol/L — ABNORMAL LOW (ref 20.0–28.0)
Bicarbonate: 19.7 mmol/L — ABNORMAL LOW (ref 20.0–28.0)
Bicarbonate: 21.2 mmol/L (ref 20.0–28.0)
O2 SAT: 100 %
O2 SAT: 100 %
O2 SAT: 85 %
O2 SAT: 89 %
O2 Saturation: 93 %
O2 Saturation: 100 %
PCO2 ART: 30.6 mmHg — AB (ref 32.0–48.0)
PCO2 ART: 32.3 mmHg (ref 32.0–48.0)
PCO2 ART: 58.1 mmHg — AB (ref 32.0–48.0)
PH ART: 7.174 — AB (ref 7.350–7.450)
PH ART: 7.316 — AB (ref 7.350–7.450)
PO2 ART: 372 mmHg — AB (ref 83.0–108.0)
PO2 ART: 402 mmHg — AB (ref 83.0–108.0)
PO2 ART: 579 mmHg — AB (ref 83.0–108.0)
PO2 ART: 58 mmHg — AB (ref 83.0–108.0)
TCO2: 13 mmol/L (ref 0–100)
TCO2: 16 mmol/L (ref 0–100)
TCO2: 17 mmol/L (ref 0–100)
TCO2: 21 mmol/L (ref 0–100)
TCO2: 23 mmol/L (ref 0–100)
TCO2: 29 mmol/L (ref 0–100)
pCO2 arterial: 29.3 mmHg — ABNORMAL LOW (ref 32.0–48.0)
pCO2 arterial: 41.2 mmHg (ref 32.0–48.0)
pCO2 arterial: 53.7 mmHg — ABNORMAL HIGH (ref 32.0–48.0)
pH, Arterial: 7.17 — CL (ref 7.350–7.450)
pH, Arterial: 7.207 — ABNORMAL LOW (ref 7.350–7.450)
pH, Arterial: 7.351 (ref 7.350–7.450)
pH, Arterial: 7.395 (ref 7.350–7.450)
pO2, Arterial: 56 mmHg — ABNORMAL LOW (ref 83.0–108.0)
pO2, Arterial: 84 mmHg (ref 83.0–108.0)

## 2016-06-04 LAB — PREPARE CRYOPRECIPITATE
UNIT DIVISION: 0
UNIT DIVISION: 0
Unit division: 0
Unit division: 0

## 2016-06-04 LAB — ECHO INTRAOPERATIVE TEE
HEIGHTINCHES: 64 in
WEIGHTICAEL: 2880 [oz_av]

## 2016-06-04 LAB — PREPARE FRESH FROZEN PLASMA
UNIT DIVISION: 0
UNIT DIVISION: 0
Unit division: 0
Unit division: 0

## 2016-06-04 LAB — FIBRINOGEN: Fibrinogen: <60 mg/dL — CL (ref 210–475)

## 2016-06-04 MED ORDER — PROTAMINE SULFATE 10 MG/ML IV SOLN
INTRAVENOUS | Status: DC | PRN
  Administered 2016-06-04: 250 mg via INTRAVENOUS

## 2016-06-04 MED ORDER — TRANEXAMIC ACID 1000 MG/10ML IV SOLN
1.5000 mg/kg/h | INTRAVENOUS | Status: DC
  Filled 2016-06-04: qty 25

## 2016-06-04 MED ORDER — EPINEPHRINE PF 1 MG/10ML IJ SOSY
PREFILLED_SYRINGE | INTRAMUSCULAR | Status: DC | PRN
  Administered 2016-06-04: 1.75 mg via INTRAVENOUS
  Administered 2016-06-04: .1 mg via INTRAVENOUS
  Administered 2016-06-04: .25 mg via INTRAVENOUS

## 2016-06-04 MED FILL — Calcium Chloride Inj 10%: INTRAVENOUS | Qty: 10 | Status: AC

## 2016-06-04 MED FILL — Heparin Sodium (Porcine) Inj 1000 Unit/ML: INTRAMUSCULAR | Qty: 30 | Status: AC

## 2016-06-04 MED FILL — Electrolyte-R (PH 7.4) Solution: INTRAVENOUS | Qty: 6000 | Status: AC

## 2016-06-04 MED FILL — Sodium Bicarbonate IV Soln 8.4%: INTRAVENOUS | Qty: 300 | Status: AC

## 2016-06-04 MED FILL — Potassium Chloride Inj 2 mEq/ML: INTRAVENOUS | Qty: 40 | Status: AC

## 2016-06-04 MED FILL — Lidocaine HCl IV Inj 20 MG/ML: INTRAVENOUS | Qty: 5 | Status: AC

## 2016-06-04 MED FILL — Sodium Chloride IV Soln 0.9%: INTRAVENOUS | Qty: 3000 | Status: AC

## 2016-06-04 MED FILL — Mannitol IV Soln 20%: INTRAVENOUS | Qty: 500 | Status: AC

## 2016-06-04 MED FILL — Magnesium Sulfate Inj 50%: INTRAMUSCULAR | Qty: 10 | Status: AC

## 2016-06-10 ENCOUNTER — Encounter (HOSPITAL_COMMUNITY): Payer: Self-pay | Admitting: Cardiothoracic Surgery

## 2016-07-02 NOTE — Op Note (Signed)
Martha Meyer, Martha Meyer                ACCOUNT NO.:  [id number]  MEDICAL RECORD NO.:  [id number]  LOCATION:                                 FACILITY:  PHYSICIAN:  Lanelle Bal, MD    DATE OF BIRTH:  September 15, 1950  DATE OF PROCEDURE:  06/21/2016 DATE OF DISCHARGE:  2016-06-21                              OPERATIVE REPORT   PREOPERATIVE DIAGNOSES: 1. Acute ST-elevation myocardial infarction with inferior myocardial     infarction, cardiogenic shock, and cardiac arrest preoperatively x3     in the cath lab. 2. Acute aortic dissection type A.  POSTOPERATIVE DIAGNOSES: 1. Acute ST-elevation myocardial infarction with inferior myocardial     infarction, cardiogenic shock, and cardiac arrest preoperatively x3     in the cath lab. 2. Acute aortic dissection type A. 3. Expired in OR  PROCEDURE PERFORMED: 1. Salvage Cardiac Surgery after cardiac arrest in Cath Lab x3  2. Resuspension of the aortic valve, replacement of the     ascending aorta and aortic arch distally beyond the takeoff of the     left subclavian with reattachment of the innominate artery, left     carotid artery, and left subclavian artery. 2. Coronary artery bypass grafting x1 with reverse saphenous vein     graft to the posterior descending. 3. Placement of femoral venous catheter.  SURGEON:  Lanelle Bal, MD  FIRST ASSISTANT:  Lars Pinks, PA.  BRIEF HISTORY:  The patient is a 66 year old female with history of hypertension, no previous cardiac history who presented to the [facility] Emergency Room via EMS with severe chest pain, hypotension, was clinically diagnosed as a STEMI.  She was taken to the cath lab and underwent cardiac catheterization.  In the cath lab, she required defibrillation x6 for VFib arrest multiple times, was in cardiogenic shock.  Cardiac catheterization was carried out initially via the right radial.  Initial injection of the left coronary showed patent coronaries.  Attempts at  injection of the right, diagnosis of acute ascending aortic dissection involving the takeoff of the right coronary artery and severe AI was then appreciated.  The patient for urgent emergency salvage consultation was requested and initially Dr. Roxan Hockey saw the patient, and she was transported emergently to the OR.  Dr. Orene Desanctis placed Swan-Ganz and TEE.  The patient had been intubated in the cath lab before surgical consultation.  I spoke to the patient's family and obtained consent to proceed with attempt at surgical repair of her ascending aorta and arch.  The patient's family was made aware of the very critical nature of her situation and agreed to proceeding.  DESCRIPTION OF THE PROCEDURE:  As noted, the patient had arrested multiple times in the cath lab, was brought to the operating room, intubated in cardiogenic shock.  Blood pressure of 60 to 70.  The skin of the neck, chest, abdomen, and lower extremities were prepped with Betadine and draped in usual sterile manner.  Appropriate time-out was performed.  We initially proceeded with an incision over the infraclavicular area.  Dissection was carried down separating the pectoralis muscles and dividing the pectoralis minor.  This gave good exposure to a right axillary artery.  This was encircled with vessel loops to obtain cannulation.  We then continued with sternotomy. Initially, the patient's arterial pressures and PA pressures had equalized concerning for tamponade.  The pericardium was opened.  There was no free blood in the pericardium.  The patient had severe right ventricular dysfunction and inferior akinesis.  The aorta was obviously dissected.  The patient was systemically heparinized.  The axillary artery was clamped proximally and distally and opened.  An 8 mm Terumo cortex cannula was then anastomosed with a running 5-0 Prolene to the axillary artery.  Coseal was placed around the anastomosis and the patient was  connected to the arterial line of the pump and clamps on the axillary artery were removed.  The patient's right atrium was significantly distended and tight.  A very thinned out pledgetted pursestring suture was placed and dual venous cannula was placed.  The patient was immediately placed on cardiopulmonary bypass.  We then placed a right superior pulmonary vein vent.  With the patient on pump, it became apparent that the PA tracing and pressures obtained were suspicious that the Swan was in the carotid artery and not the right internal jugular.  At this point, Dr. Linna Caprice had become our anesthesiologist and with examination of the TEE of the descending aorta and palpation of the PA it was clear that the Swan was in the distal descending aorta.  The sheath was left in the right carotid artery, but the Swan was retracted and removed.  While on pump we needed reliable venous access for medications and drips, a right femoral triple lumen infusion catheter was placed in the left subclavian vein and sterile tubings were placed, were passed anesthesia for reliable venous access. We continued to cool.  The patient had a transvenous pacemaker applied after we were on pump.  This temporary transvenous pacemaker was removed.  The steroids were administered as we prepared for circulatory arrest.  The innominate artery was dissected and freed to be able to clamp it.  We proceeded with initial hypothermic circulatory arrest as we approached 18 degrees body temperature and venous return was 17 degrees.  When circulatory arrest was begun, the clamp was placed on the innominate artery.  The aorta was opened.  As noted, there was significant dissection.  Proximally, the aortic root and aortic valve leaflets appeared normal, though the dissection flap appeared to have started in the arch.  There was no entry point in the proximal aorta and the origin of the right coronary artery was intact.  The  radiographic findings were related to the compression on the ostium of the right coronary artery with the false lumen.  We then proceeded with antegrade cerebral flow intermittently as we dissected out the left carotid and subclavian.  As we examined the arch, it was clear that the proximal innominate artery was disrupted as was the carotid and subclavian requiring complete replacement of the arch and reimplantation of the left subclavian, left carotid, and innominate.  We chose a 28 mm Hemashield graft with 4 branches coming off it.  The distal end was trimmed to the appropriate length, was invaginated, and on the folded edge, distal anastomosis to the aorta was performed with interrupted pledgeted sutures circumferentially, folded edge of the graft was in impressions into the distal aorta.  We then trimmed each of the three 8 mm arms in a sequential manner, anastomosed them to the left subclavian with running 5-0 Prolene sutures over felt strips.  In a similar fashion, the left  carotid anastomosis to the short segment of the branched arm was carried out as was the innominate.  The 4th 10 mm graft branch was stapled.  We then resumed full flow through the arch and the right axillary artery De-airing the arch and placing a clamp on the graft returning to full flow.  We began rewarming at this point and turned our attention to the proximal aorta and aortic root.  Pledgeted Prolene sutures were placed at the commissures of the trileaflet aortic valve re-suspending the valve.  Careful inspection of the ostium of the right coronary artery appeared improving with a 2 mm probe and without obvious disruption of the right coronary and aorta.  The proximal aorta was trimmed at approximately the sinotubular ridge.  Felt strips were placed around and proximal anastomosis carried out with running 3-0 Prolene.  The posterior wall of the aorta was reinforced with interrupted 4-0 Prolenes.  We continued  the proximal anastomosis anteriorly and then allowed the heart to passively fill and de-air as the cross-clamp was removed.  Warm retrograde plegia had been administered.  Aortic crossclamp was removed.  The patient required atrial and ventricular pacing wires to be applied as initially was asystolic.  As we warmed, she did regain electrical activity and was atrially paced, increased rate.  Each of the anastomoses were inspected. The distal anastomosis and the anastomoses to each of the cerebral vessels were intact and without bleeding.  Additional pledgeted sutures were placed anteriorly along the proximal suture line to control bleeding.  Other than diffuse coagulopathy, there was no suture line bleeding and as the patient rewarmed, we attempted to come off bypass. TEE showed global hypokinesis, particularly right ventricle hypokinesis. The aortic valve was re-suspended well without any evidence of aortic insufficiency.  The right superior pulmonary vein vent was removed. Retrograde cardioplegia catheter was removed.  We separated from bypass on epinephrine and Levophed; however, the patient's right ventricle continued to fail and we went back on bypass, although the right coronary artery was felt to be patent at the ostium.  We decided to use the piece of vein that we had harvested endoscopically from the right greater saphenous.  I placed a partial occlusion clamp on the ascending aortic graft and sewed the vein graft to that, trimmed to the appropriate length.  Put last 2 tapes around the distal right coronary artery, opened the vessel, and the distal anastomosis with a running 7-0 Prolene flushing air from the graft and completing a single coronary artery bypass grafting to the distal right coronary artery.  This remained hemostatic.  We then made several attempts at coming off bypass primarily limited by ability to oxygenate and inferior and right ventricular hypokinesis.  With  large doses of pressors and boluses of epinephrine, we were able to separate from cardiopulmonary bypass with some blood pressure; however, the patient continued to deteriorate and ultimately at 1:37 a.m. on 06-15-2016, attempts at further resuscitation were ceased as it became obvious that further efforts were futile.  The patient's family was notified that she had expired in the operating room, they did wish to see her, which these arrangements were made and declined an autopsy.  The medical examiner was notified and declined the case. Total pump time 367 min, XC 62 min, hypothermic arrest 132 min     Lanelle Bal, MD   ______________________________ Lanelle Bal, MD    EG/MEDQ  D:  06/06/2016  T:  06/07/2016  Job:  [id number]

## 2016-07-02 NOTE — Brief Op Note (Addendum)
       [address]       [place] [address]             [phone]      06/25/2016 - June 06, 2016  2:18 AM  PATIENT:  Martha Meyer  66 y.o. female  PRE-OPERATIVE DIAGNOSIS:  aortic dissection type A  POST-OPERATIVE DIAGNOSIS:  aortic dissection  PROCEDURE:  Procedure(s): REPAIR OF ACUTE ASCENDING THORACIC AORTIC DISSECTION (N/A) With re suspension of aortic valve , replacement of ascending aorta and arch with grafts to left subclavian, left carotid and inanimate aretery With hypothermic circulatory arrest  Lymph node biopsy  right axillary   CABG x1 with vein graft to rca, right greater saphenous endo vein harvest  SURGEON:  Surgeon(s) and Role:    * Grace Isaac, MD - Primary  PHYSICIAN ASSISTANT: D Zimmerman pa    ANESTHESIA:   general  EBL:  Total I/O In: 699 [Blood:699] Out: 200 [Urine:200]  BLOOD ADMINISTERED: per or record record    SPECIMEN:  Source of Specimen:  right axillary and aorta   DISPOSITION OF SPECIMEN:  PATHOLOGY  COUNTS:  YES  DICTATION: .Dragon Dictation  PLAN OF CARE: expired in or primary due to rv infarct and inability to oxygenate , repair intact with no ai   PATIENT DISPOSITION:  expired in or   Delay start of Pharmacological VTE agent (>24hrs) due to surgical blood loss or risk of bleeding: not applicable

## 2016-07-02 NOTE — Progress Notes (Signed)
   2016/06/17 0300  Clinical Encounter Type  Visited With Family  Visit Type Death  Referral From Nurse  Spiritual Encounters  Spiritual Needs Grief support;Emotional;Prayer  [place] called to support family upon pt death; [place] offered grief, emotional, and spiritual support along with prayer.  Gwynn Burly 3:05 AM

## 2016-07-02 NOTE — OR Nursing (Signed)
Charge nurse updated family on patient's condition per Dr. Everrett Coombe request.

## 2016-07-02 NOTE — Transfer of Care (Signed)
Immediate Anesthesia Transfer of Care Note  Patient: Martha Meyer  Procedure(s) Performed: Procedure(s): REPAIR OF ACUTE ASCENDING THORACIC AORTIC DISSECTION (N/A)  Time of death 0137

## 2016-07-02 NOTE — Anesthesia Postprocedure Evaluation (Signed)
Anesthesia Post Note  Patient: Martha Meyer  Procedure(s) Performed: Procedure(s) (LRB): REPAIR OF ACUTE ASCENDING THORACIC AORTIC DISSECTION (N/A)  Patient location during evaluation: Other (time of death 09-01-2022) Anesthesia Type: General         Avi Archuleta

## 2016-07-02 NOTE — OR Nursing (Signed)
Patient died @ 1:37am in [facility] - medical examiner  - Jeanette Caprice, MD contacted.  Also [employer] contacted - spoke to Rocky Crafts for pertinent patient information.  [facility] referred patient for Tissue and eye donation - Referral no. Given [id number].

## 2016-07-02 NOTE — Anesthesia Postprocedure Evaluation (Signed)
Anesthesia Post Note  Patient: Martha Meyer  Procedure(s) Performed: Procedure(s) (LRB): REPAIR OF ACUTE ASCENDING THORACIC AORTIC DISSECTION (N/A) With re suspension of aortic valve , replacement of ascending aorta and arch with grafts to left subclavian, left carotid and inanimate aretery With hypothermic circulatory arrest  Lymph node biopsy  right axillary (N/A)  Patient location during evaluation: Other Anesthesia Type: General Comments: Patient expired in the operating room due to persistent hypoxemia and hypotension with uncontrollable bleeding.       Last Vitals:  Vitals:   06/14/2016 1640 06/29/2016 1645  BP: (!) 75/44   Pulse: 60 (!) 0  Resp: 13 (!) 0    Last Pain: There were no vitals filed for this visit.               Alexia Dinger COKER

## 2016-07-02 NOTE — Discharge Summary (Signed)
Physician Discharge Summary       [address]       [place] [address]             [phone]    Patient ID: Martha Meyer MRN: [id number] DOB/AGE: [birth year] 66 y.o.  Admit date: 06/13/2016 Discharge date: 06/17/2016  Admission Diagnoses: 1. Acute aortic dissection type A.(HCC) 2.  Acute ST elevation myocardial infarction (STEMI) of inferior wall involving right ventricle (HCC)  Active Diagnoses:  1. Hypertension 2. Hyperlipidemia 3. GERD (gastroesophageal reflux disease) 4. Depression 5. Obstructive sleep apnea-on CPAP 6. Migraines 7. Gout 8. Osteoarthritis  Procedure (s):  1. Salvage Cardiac Surgery after cardiac arrest in Cath Lab x3  2. Resuspension of the aortic valve, replacement of the     ascending aorta and aortic arch distally beyond the takeoff of the     left subclavian with reattachment of the innominate artery, left     carotid artery, and left subclavian artery. 2. Coronary artery bypass grafting x1 with reverse saphenous vein     graft to the posterior descending. 3. Placement of femoral venous catheter by Dr. Servando Snare on 06-30-16.   History of Presenting Illness: Ms. Polivka is a 66-y/o woman with history of HTN, HLD, OSA, and GERD, who had acute onset of left upper chest pain radiating to the neck at ~2:30 PM while getting ready to walk her dog. She reports accompanying shortness of breath but denies nausea, vomiting, and back pain.  EMS was summoned and found the patient to be bradycardia.  Initial EKG showed complete heart block with junctional escape and marked inferior ST segment elevation.  Subsequent tracings showed restoration of sinus rhythm with normal AV conduction but persistent inferior ST elevation.  The patient received ASA and SL NTG with hypotensive response requiring fluid bolus.  Upon arrival in the ED, the patient was initially confused and unable to answer questions coherently.  However, after ~5 minutes, she became  alert and oriented, complaining only of chest and left-sided neck pain.  Though no focal neuro deficits were observed in the ED, STAT head CT was performed to exclude intracranial process.  As this was negative, the patient was referred for emergent LHC.  She was found to have type A aortic dissection with severe aortic regurgitation and mildly to moderately reduced LV function with inferior hypokinesis.  The patient had recurrence of complete heart block with ventricular rate in the 30's, for which a temporary transvenous pacemaker was placed.  She also had 3 episodes of ventricular fibrillation that were successfully treated with defibrillation and IV amiodarone.  She was intubated and sedated for comfort and airway protection, given recurrent VF requiring shocks.  The patient was evaluated by Dr. Roxan Hockey and was taken for emergent aortic repair by Dr. Servando Snare.  Brief Hospital Course:  We then made several attempts at coming off bypass primarily limited by ability to oxygenate and inferior and right ventricular hypokinesis.  With large doses of pressors and boluses of epinephrine, we were able to separate from cardiopulmonary bypass with some blood pressure; however, the patient continued to deteriorate and ultimately at 1:37 a.m. on 2016/06/30, attempts at further resuscitation were ceased as it became obvious that further efforts were futile.  The patient's family was notified that she had expired in the operating room, they did wish to see her, which these arrangements were made and declined an autopsy.   Latest Vital Signs: Blood pressure (!) 75/44, pulse (!) 0, resp.  rate (!) 0, height 5\' 4"  (1.626 m), weight 180 lb (81.6 kg), SpO2 (!) 0 %.   Recent laboratory studies:  Lab Results  Component Value Date   WBC 9.5 06/27/2016   HGB 7.5 (L) 06-26-2016   HCT 22.0 (L) June 26, 2016   MCV 89.0 06/02/2016   PLT 27 (LL) 06/24/2016   Lab Results  Component Value Date   NA 146 (H)  06-26-16   K 4.0 Jun 26, 2016   CL 97 (L) 06-26-16   CO2 20 (L) 06/30/2016   CREATININE 1.10 (H) 2016-06-26   GLUCOSE 261 (H) Jun 26, 2016      Diagnostic Studies: Ct Head Wo Contrast  Result Date: 06/22/2016 CLINICAL DATA:  Confusion. EXAM: CT HEAD WITHOUT CONTRAST TECHNIQUE: Contiguous axial images were obtained from the base of the skull through the vertex without intravenous contrast. COMPARISON:  None. FINDINGS: Brain: Mild chronic ischemic white matter disease is noted. No mass effect or midline shift is noted. Ventricular size is within normal limits. There is no evidence of mass lesion, hemorrhage or acute infarction. Vascular: No hyperdense vessel or unexpected calcification. Skull: Normal. Negative for fracture or focal lesion. Sinuses/Orbits: No acute finding. Other: None. IMPRESSION: Mild chronic ischemic white matter disease. No acute intracranial abnormality seen. Electronically Signed   By: Marijo Conception, M.D.   On: 06/01/2016 15:32   Signed: Lars Pinks MPA-C 06/17/2016, 9:27 AM

## 2016-07-02 DEATH — deceased

## 2017-03-29 ENCOUNTER — Encounter: Payer: Managed Care, Other (non HMO) | Admitting: Internal Medicine

## 2018-04-09 IMAGING — CT CT HEAD W/O CM
3 of 4 series · 18 of 47 positions shown, 21 images · non-contrast
Comparison: None.

CLINICAL DATA: Confusion.

EXAM:
CT HEAD WITHOUT CONTRAST
TECHNIQUE: Contiguous axial images were obtained from the base of the skull
through the vertex without intravenous contrast.

[Series 201: head w/o, idose (1) · axial · non-contrast · 0.41mm/px · z∈[+1031,+1151]mm · 12 of 29 slices shown, 15 images]
[im 3/29  brain]
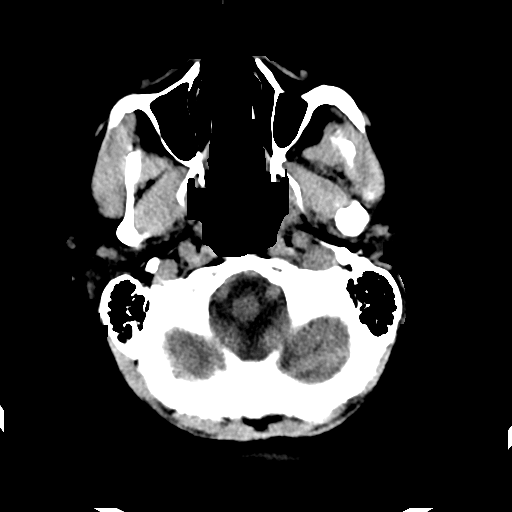
[im 3/29  bone]
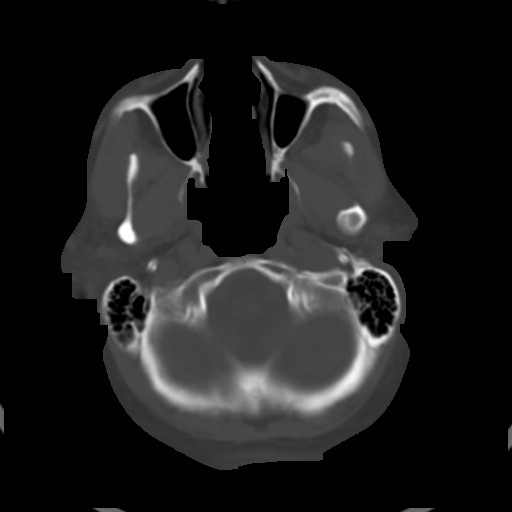
[im 5/29  brain]
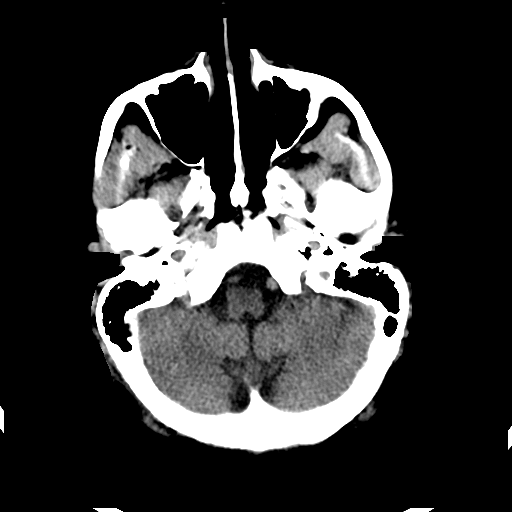
[im 7/29  brain]
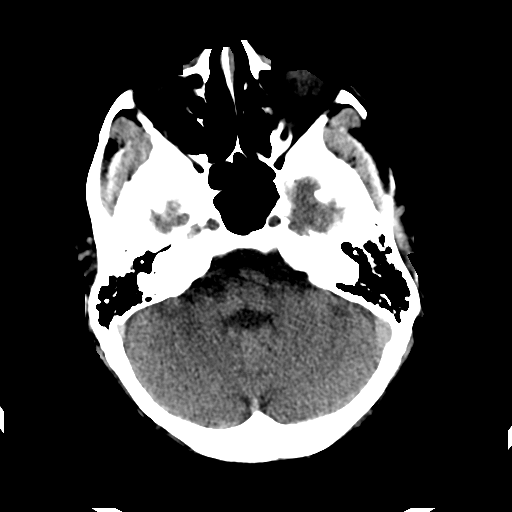
[im 9/29  brain]
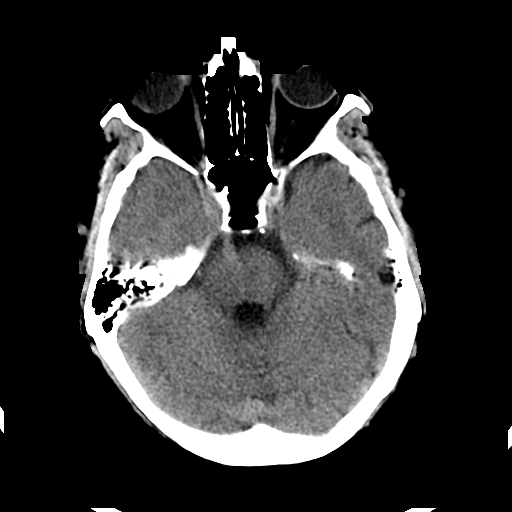
[im 11/29  brain]
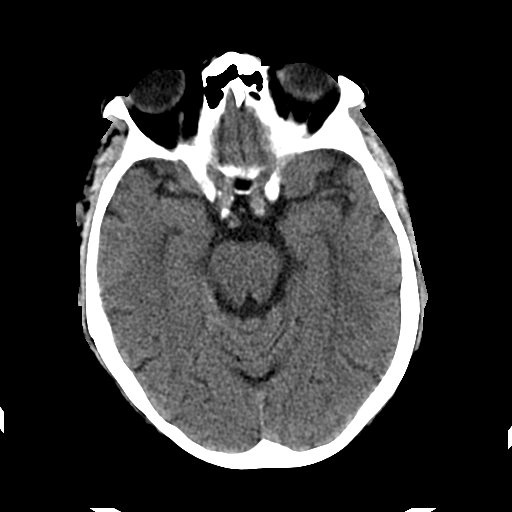
[im 11/29  bone]
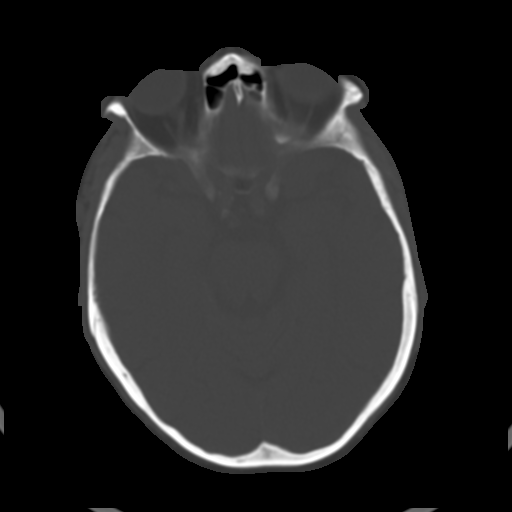
[im 13/29  brain]
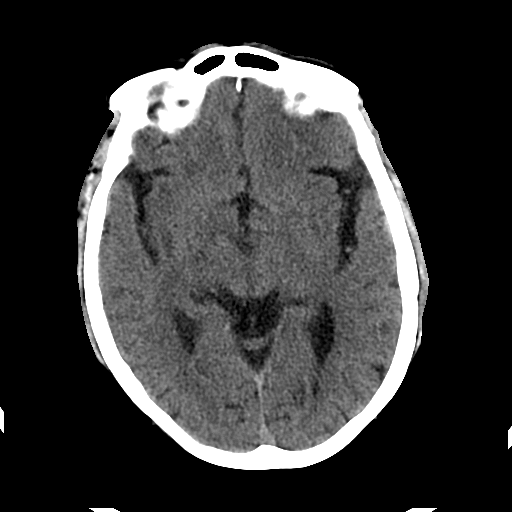
[im 17/29  brain]
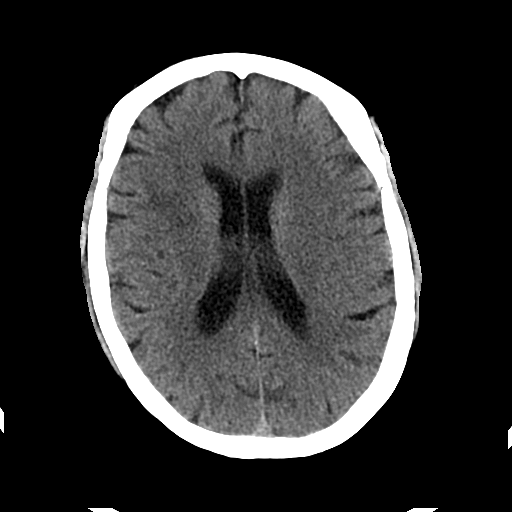
[im 19/29  brain]
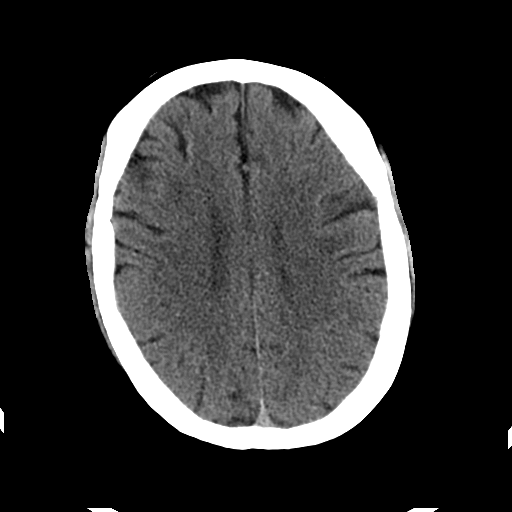
[im 21/29  brain]
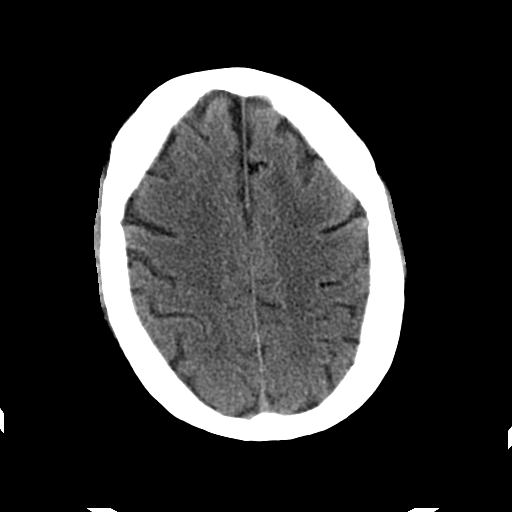
[im 21/29  bone]
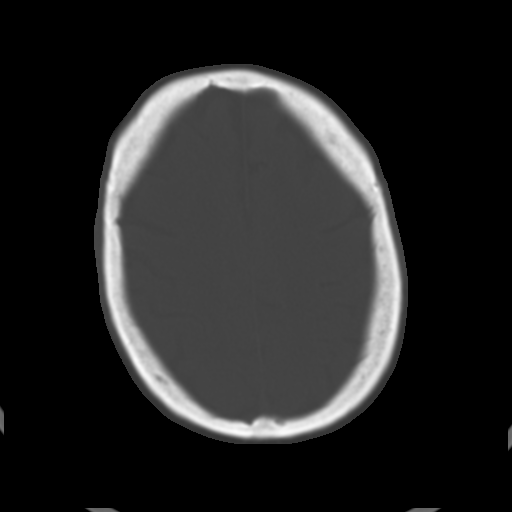
[im 23/29  brain]
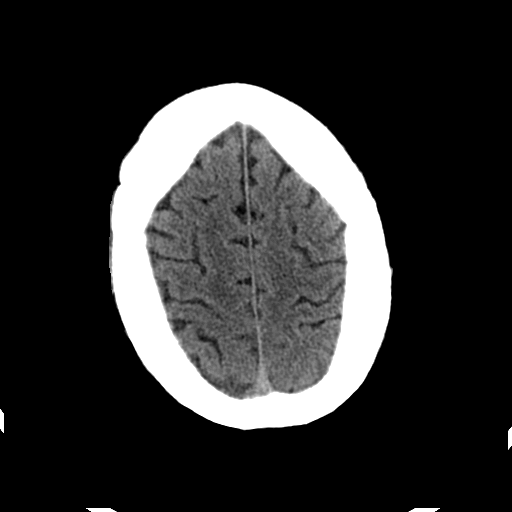
[im 25/29  brain]
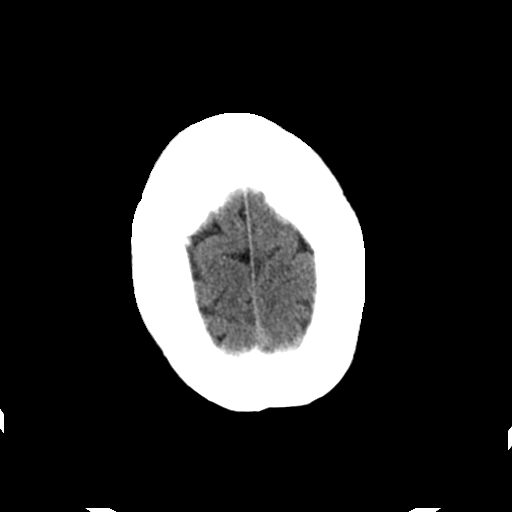
[im 27/29  brain]
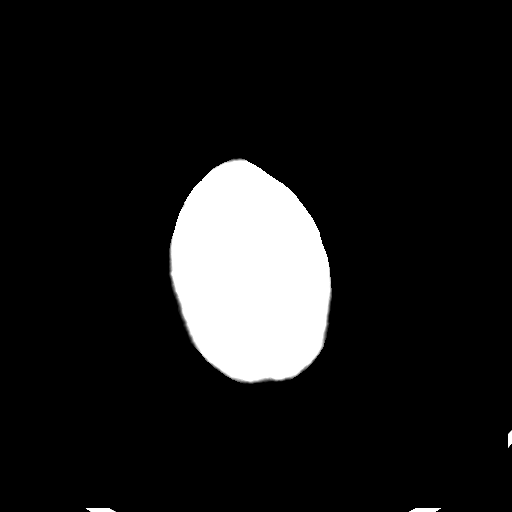

[Series 203: coronal st, idose (1) · coronal · 0.40mm/px · 3 of 67 slices shown]
[im 23/67  brain]
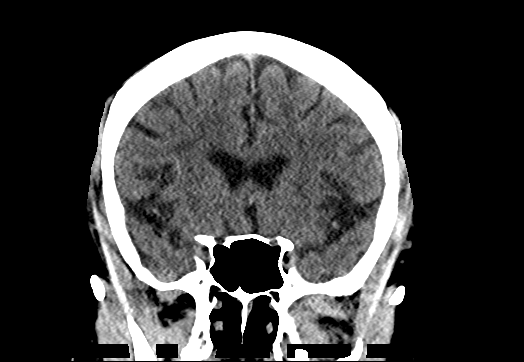
[im 30/67  brain]
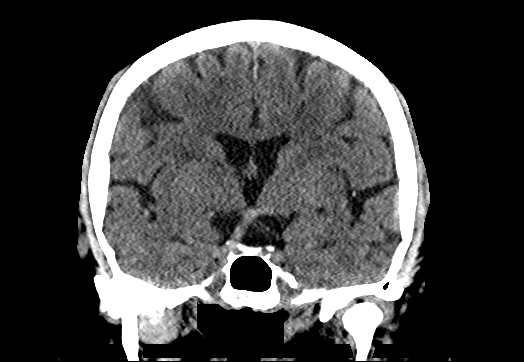
[im 37/67  brain]
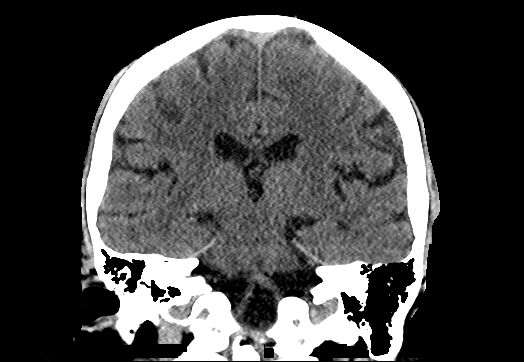

[Series 204: sagittal st, idose (1) · sagittal · 0.40mm/px · 3 of 67 slices shown]
[im 23/67  brain]
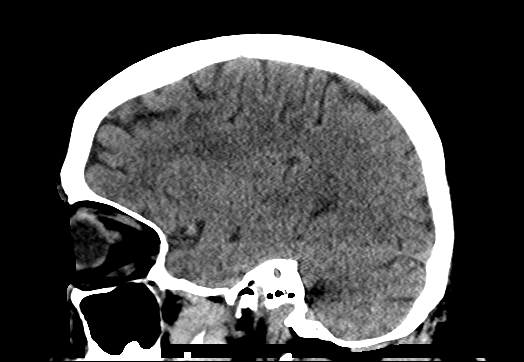
[im 34/67  brain]
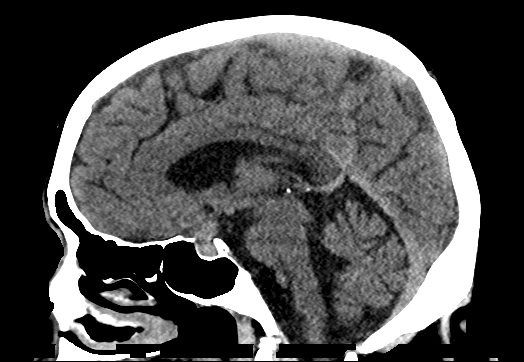
[im 45/67  brain]
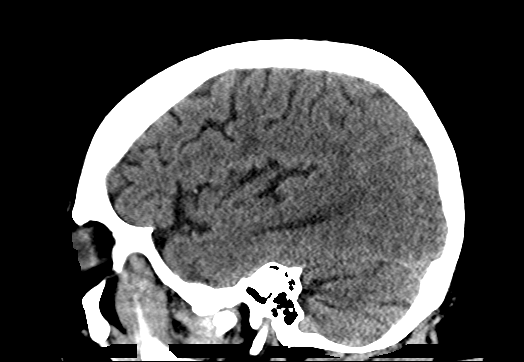

[18 of 47 positions shown; findings below may reference images not displayed]

FINDINGS: Brain: Mild chronic ischemic white matter disease is noted. No mass
effect or midline shift is noted. Ventricular size is within normal
limits. There is no evidence of mass lesion, hemorrhage or acute
infarction.

Vascular: No hyperdense vessel or unexpected calcification.

Skull: Normal. Negative for fracture or focal lesion.

Sinuses/Orbits: No acute finding.

Other: None.
IMPRESSION: Mild chronic ischemic white matter disease. No acute intracranial
abnormality seen.
